# Patient Record
Sex: Female | Born: 1945 | Race: Black or African American | Hispanic: No | Marital: Married | Smoking: Never smoker
Health system: Southern US, Community
[De-identification: ages and names within clinical notes are randomized; demographics above are authoritative.]

## PROBLEM LIST (undated history)

## (undated) DIAGNOSIS — M1990 Unspecified osteoarthritis, unspecified site: Secondary | ICD-10-CM

## (undated) DIAGNOSIS — M2240 Chondromalacia patellae, unspecified knee: Secondary | ICD-10-CM

## (undated) DIAGNOSIS — I10 Essential (primary) hypertension: Secondary | ICD-10-CM

## (undated) HISTORY — PX: TONSILLECTOMY: SUR1361

## (undated) HISTORY — DX: Chondromalacia patellae, unspecified knee: M22.40

## (undated) HISTORY — DX: Unspecified osteoarthritis, unspecified site: M19.90

## (undated) HISTORY — DX: Essential (primary) hypertension: I10

## (undated) HISTORY — PX: TUBAL LIGATION: SHX77

## (undated) HISTORY — PX: APPENDECTOMY: SHX54

## (undated) HISTORY — PX: BARTHOLIN CYST MARSUPIALIZATION: SHX5383

---

## 1998-07-10 ENCOUNTER — Other Ambulatory Visit: Admission: RE | Admit: 1998-07-10 | Discharge: 1998-07-10 | Payer: Self-pay | Admitting: Obstetrics & Gynecology

## 1999-07-25 ENCOUNTER — Other Ambulatory Visit: Admission: RE | Admit: 1999-07-25 | Discharge: 1999-07-25 | Payer: Self-pay | Admitting: Obstetrics & Gynecology

## 2000-08-25 ENCOUNTER — Other Ambulatory Visit: Admission: RE | Admit: 2000-08-25 | Discharge: 2000-08-25 | Payer: Self-pay | Admitting: Obstetrics & Gynecology

## 2001-11-02 ENCOUNTER — Other Ambulatory Visit: Admission: RE | Admit: 2001-11-02 | Discharge: 2001-11-02 | Payer: Self-pay | Admitting: Obstetrics & Gynecology

## 2002-09-27 ENCOUNTER — Encounter: Payer: Self-pay | Admitting: Gastroenterology

## 2002-09-27 ENCOUNTER — Ambulatory Visit (HOSPITAL_COMMUNITY): Admission: RE | Admit: 2002-09-27 | Discharge: 2002-09-27 | Payer: Self-pay | Admitting: Gastroenterology

## 2002-11-09 ENCOUNTER — Other Ambulatory Visit: Admission: RE | Admit: 2002-11-09 | Discharge: 2002-11-09 | Payer: Self-pay | Admitting: Obstetrics & Gynecology

## 2003-04-18 ENCOUNTER — Ambulatory Visit (HOSPITAL_COMMUNITY): Admission: RE | Admit: 2003-04-18 | Discharge: 2003-04-18 | Payer: Self-pay | Admitting: Gastroenterology

## 2003-04-20 ENCOUNTER — Ambulatory Visit (HOSPITAL_COMMUNITY): Admission: RE | Admit: 2003-04-20 | Discharge: 2003-04-20 | Payer: Self-pay | Admitting: Gastroenterology

## 2003-04-20 ENCOUNTER — Encounter: Payer: Self-pay | Admitting: Gastroenterology

## 2003-10-09 ENCOUNTER — Ambulatory Visit (HOSPITAL_COMMUNITY): Admission: RE | Admit: 2003-10-09 | Discharge: 2003-10-09 | Payer: Self-pay | Admitting: Gastroenterology

## 2003-11-22 ENCOUNTER — Other Ambulatory Visit: Admission: RE | Admit: 2003-11-22 | Discharge: 2003-11-22 | Payer: Self-pay | Admitting: Obstetrics & Gynecology

## 2004-08-26 ENCOUNTER — Ambulatory Visit: Payer: Self-pay | Admitting: Orthopedic Surgery

## 2004-09-16 ENCOUNTER — Ambulatory Visit: Payer: Self-pay | Admitting: Orthopedic Surgery

## 2004-10-09 ENCOUNTER — Ambulatory Visit: Payer: Self-pay | Admitting: Orthopedic Surgery

## 2004-11-18 ENCOUNTER — Ambulatory Visit: Payer: Self-pay | Admitting: Orthopedic Surgery

## 2004-11-27 ENCOUNTER — Other Ambulatory Visit: Admission: RE | Admit: 2004-11-27 | Discharge: 2004-11-27 | Payer: Self-pay | Admitting: Obstetrics & Gynecology

## 2004-11-28 ENCOUNTER — Encounter (HOSPITAL_COMMUNITY): Admission: RE | Admit: 2004-11-28 | Discharge: 2004-12-28 | Payer: Self-pay | Admitting: Orthopedic Surgery

## 2004-12-31 ENCOUNTER — Encounter (HOSPITAL_COMMUNITY): Admission: RE | Admit: 2004-12-31 | Discharge: 2005-01-30 | Payer: Self-pay | Admitting: Orthopedic Surgery

## 2005-01-20 ENCOUNTER — Ambulatory Visit: Payer: Self-pay | Admitting: Orthopedic Surgery

## 2005-07-23 ENCOUNTER — Ambulatory Visit: Payer: Self-pay | Admitting: Orthopedic Surgery

## 2006-01-29 ENCOUNTER — Ambulatory Visit: Payer: Self-pay | Admitting: Orthopedic Surgery

## 2006-03-05 ENCOUNTER — Ambulatory Visit: Payer: Self-pay | Admitting: Orthopedic Surgery

## 2007-04-01 ENCOUNTER — Ambulatory Visit: Payer: Self-pay | Admitting: Orthopedic Surgery

## 2007-10-29 ENCOUNTER — Ambulatory Visit: Payer: Self-pay | Admitting: Gastroenterology

## 2007-11-15 ENCOUNTER — Ambulatory Visit: Payer: Self-pay | Admitting: Gastroenterology

## 2008-02-23 ENCOUNTER — Ambulatory Visit: Payer: Self-pay | Admitting: Orthopedic Surgery

## 2008-02-23 DIAGNOSIS — M76899 Other specified enthesopathies of unspecified lower limb, excluding foot: Secondary | ICD-10-CM | POA: Insufficient documentation

## 2008-02-23 DIAGNOSIS — M25559 Pain in unspecified hip: Secondary | ICD-10-CM | POA: Insufficient documentation

## 2008-03-13 ENCOUNTER — Telehealth: Payer: Self-pay | Admitting: Orthopedic Surgery

## 2008-03-27 ENCOUNTER — Ambulatory Visit: Payer: Self-pay | Admitting: Orthopedic Surgery

## 2008-03-27 DIAGNOSIS — IMO0002 Reserved for concepts with insufficient information to code with codable children: Secondary | ICD-10-CM | POA: Insufficient documentation

## 2008-04-03 ENCOUNTER — Telehealth: Payer: Self-pay | Admitting: Orthopedic Surgery

## 2008-07-31 ENCOUNTER — Ambulatory Visit: Payer: Self-pay | Admitting: Orthopedic Surgery

## 2008-08-04 ENCOUNTER — Telehealth: Payer: Self-pay | Admitting: Orthopedic Surgery

## 2008-08-07 ENCOUNTER — Ambulatory Visit (HOSPITAL_COMMUNITY): Admission: RE | Admit: 2008-08-07 | Discharge: 2008-08-07 | Payer: Self-pay | Admitting: Orthopedic Surgery

## 2008-08-21 ENCOUNTER — Ambulatory Visit: Payer: Self-pay | Admitting: Orthopedic Surgery

## 2008-09-15 ENCOUNTER — Telehealth: Payer: Self-pay | Admitting: Orthopedic Surgery

## 2008-10-02 ENCOUNTER — Encounter: Payer: Self-pay | Admitting: Orthopedic Surgery

## 2008-11-07 ENCOUNTER — Encounter: Payer: Self-pay | Admitting: Orthopedic Surgery

## 2008-11-20 ENCOUNTER — Ambulatory Visit: Payer: Self-pay | Admitting: Orthopedic Surgery

## 2008-11-23 ENCOUNTER — Encounter: Payer: Self-pay | Admitting: Orthopedic Surgery

## 2008-12-21 ENCOUNTER — Encounter: Payer: Self-pay | Admitting: Orthopedic Surgery

## 2009-02-26 ENCOUNTER — Ambulatory Visit: Payer: Self-pay | Admitting: Orthopedic Surgery

## 2009-02-26 DIAGNOSIS — M25569 Pain in unspecified knee: Secondary | ICD-10-CM | POA: Insufficient documentation

## 2009-05-01 ENCOUNTER — Ambulatory Visit: Payer: Self-pay | Admitting: Orthopedic Surgery

## 2009-05-01 DIAGNOSIS — IMO0002 Reserved for concepts with insufficient information to code with codable children: Secondary | ICD-10-CM | POA: Insufficient documentation

## 2009-05-01 DIAGNOSIS — M1710 Unilateral primary osteoarthritis, unspecified knee: Secondary | ICD-10-CM

## 2009-06-14 ENCOUNTER — Ambulatory Visit: Payer: Self-pay | Admitting: Orthopedic Surgery

## 2009-07-26 ENCOUNTER — Ambulatory Visit: Payer: Self-pay | Admitting: Orthopedic Surgery

## 2009-08-20 ENCOUNTER — Encounter: Payer: Self-pay | Admitting: Orthopedic Surgery

## 2010-04-15 ENCOUNTER — Ambulatory Visit: Payer: Self-pay | Admitting: Orthopedic Surgery

## 2010-04-15 DIAGNOSIS — M549 Dorsalgia, unspecified: Secondary | ICD-10-CM | POA: Insufficient documentation

## 2010-04-22 ENCOUNTER — Encounter: Payer: Self-pay | Admitting: Orthopedic Surgery

## 2010-05-27 ENCOUNTER — Ambulatory Visit: Payer: Self-pay | Admitting: Orthopedic Surgery

## 2010-09-11 ENCOUNTER — Ambulatory Visit
Admission: RE | Admit: 2010-09-11 | Discharge: 2010-09-11 | Payer: Self-pay | Source: Home / Self Care | Attending: Orthopedic Surgery | Admitting: Orthopedic Surgery

## 2010-09-11 DIAGNOSIS — M1990 Unspecified osteoarthritis, unspecified site: Secondary | ICD-10-CM | POA: Insufficient documentation

## 2010-09-17 NOTE — Assessment & Plan Note (Signed)
Summary: 6 WK RE-CK BACK FOL'G PT/BCBS/CAF   Visit Type:  Follow-up  CC:  recheck back.  History of Present Illness: I saw Nancy George in the office today for a followup visit.  She is a 65 years old woman with the complaint of:  back pain.  status post physical therapy.  Not taking any medicines  Pain has resolved  Patient has excellent back flexion no tenderness  Doing well discharged from treatment for her back  Allergies: 1)  ! Penicillin   Impression & Recommendations:  Problem # 1:  BACK PAIN (ICD-724.5) Assessment Improved  Her updated medication list for this problem includes:    Celebrex 200 Mg Caps (Celecoxib) .Marland Kitchen... 1 q day  Orders: Est. Patient Level II (16109)  Patient Instructions: 1)  f/u as needed

## 2010-09-17 NOTE — Miscellaneous (Signed)
  celebrex 200 x 3 months   5 refills Clinical Lists Changes

## 2010-09-17 NOTE — Assessment & Plan Note (Signed)
Summary: RE-CK BACK/STATES TWISTED 04/07/10/NEED XRAY/BCBS/CAF   Visit Type:  Follow-up  CC:  back pain.  History of Present Illness: I saw Nancy George in the office today for a followup visit.  She is a 65 years old woman with the complaint of:  back pain.  Back pain for 2 weeks. Pain in her back on her right side and when it is bad it goes down to her knee.  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  She has a little bit of pain with the seated straight leg raise when the foot is dorsiflexed on the RIGHT but not on the LEFT  Tenderness lumbar spine and the RIGHT gluteal region  Impression strain back possible annular tear  Rest and physical therapy  Allergies: 1)  ! Penicillin  Review of Systems Neurologic:  Complains of tingling. Musculoskeletal:  Complains of instability.  Physical Exam  Additional Exam:  RIGHT SLR SEATED NEGATIVE LEFT    Impression & Recommendations:  Problem # 1:  BACK PAIN (ICD-724.5) Assessment New  The following medications were removed from the medication list:    Celebrex 200 Mg Caps (Celecoxib) .Marland Kitchen... 1 q day Her updated medication list for this problem includes:    Celebrex 200 Mg Caps (Celecoxib) .Marland Kitchen... 1 q day  Orders: Est. Patient Level III (81829)  Patient Instructions: 1)  Start PT at [facility]  2)  use heat at night  3)  TAKE ADVIL 400 MG three times a day FOR BACK PAIN  4)  RETURN IN 8 WEEKS

## 2010-09-17 NOTE — Letter (Signed)
Summary: Generic Letter  [facility]  [address]  [place], [place] [address]   Phone: [phone]  Fax: [phone]    10/02/2008  Nancy George [address] [place], [place]  [address]  To whom it may concern:   Therapeutic measures at integrative therapies would benefit her orthopaedic health conditions.  Sincerely,   Fuller Canada MD [facility]

## 2010-09-17 NOTE — Miscellaneous (Signed)
Summary: Rehab Report  Rehab Report   Imported By: Elvera Maria 11/14/2008 16:29:05  _____________________________________________________________________  External Attachment:    Type:   Image     Comment:   eval

## 2010-09-17 NOTE — Miscellaneous (Signed)
Summary: PT evaluation  PT evaluation   Imported By: Jacklynn Ganong 04/30/2010 14:26:31  _____________________________________________________________________  External Attachment:    Type:   Image     Comment:   External Document

## 2010-09-19 NOTE — Assessment & Plan Note (Signed)
Summary: BACK HURTING AGAIN,?XR BCBS/BSF   Visit Type:  Follow-up  CC:  back pain.  History of Present Illness:   This is a 22 femoral female followed for back pain. Currently taking ibuprofen 800 mg as needed. She had some back pain a few weeks ago. It was constant and it started to increase. She called for an appointment then changed her exercise routine and the symptoms started to decrease.  She does have some radiating pain down from her SI joint and gluteal region into her RIGHT foot, but primarily it radiates to the RIGHT knee.  She denies any night sweats, weight loss, or urinary symptoms.    Allergies: 1)  ! Penicillin  Physical Exam  Additional Exam:   She stands with a normal posture.  She has tenderness over the RIGHT SI joint. lower lumbar joints are nontender. She has normal tiptoe test and normal heel rise test with negative straight leg raises bilaterally, negative Lasegue's sign. Negative cross leg straight leg raise.  Pulse and perfusion are normal in the lower extremities.  Range of motion of the hips is normal with no pain.  Ambulation normal   Impression & Recommendations:  Problem # 1:  BACK PAIN (ICD-724.5)  Her updated medication list for this problem includes:    Celebrex 200 Mg Caps (Celecoxib) .Marland Kitchen... 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  We gave her an injection in the RIGHT SI joint.  verbal consent was obtained for SI joint injection Under sterile conditions, the sacroiliac joint was injected with a 1-2 mixture of Depo-Medrol and lidocaine.  No complications  Orders: Est. Patient Level III (47829) Joint Aspirate / Injection, Large (20610) Depo- Medrol 40mg  (J1030)  Patient Instructions: 1)  You have received an injection of cortisone today. You may experience increased pain at the injection site. Apply ice pack to the area for 20 minutes every 2 hours and take 2 xtra strength  tylenol every 8 hours. This increased pain will usually resolve in 24 hours. The injection will take effect in 3-10 days.  2)  Continue IBUPROFEN as needed  3)  Please schedule a follow-up appointment as needed.   Orders Added: 1)  Est. Patient Level III [56213] 2)  Joint Aspirate / Injection, Large [20610] 3)  Depo- Medrol 40mg  [J1030]

## 2011-01-02 ENCOUNTER — Other Ambulatory Visit: Payer: Self-pay | Admitting: Obstetrics & Gynecology

## 2011-12-10 ENCOUNTER — Ambulatory Visit (INDEPENDENT_AMBULATORY_CARE_PROVIDER_SITE_OTHER): Payer: BC Managed Care – PPO | Admitting: Orthopedic Surgery

## 2011-12-10 ENCOUNTER — Encounter: Payer: Self-pay | Admitting: Orthopedic Surgery

## 2011-12-10 ENCOUNTER — Ambulatory Visit (INDEPENDENT_AMBULATORY_CARE_PROVIDER_SITE_OTHER): Payer: BC Managed Care – PPO

## 2011-12-10 VITALS — BP 100/60 | Ht 65.0 in | Wt 169.0 lb

## 2011-12-10 DIAGNOSIS — M25569 Pain in unspecified knee: Secondary | ICD-10-CM

## 2011-12-10 DIAGNOSIS — M25561 Pain in right knee: Secondary | ICD-10-CM

## 2011-12-10 DIAGNOSIS — M2240 Chondromalacia patellae, unspecified knee: Secondary | ICD-10-CM | POA: Insufficient documentation

## 2011-12-10 HISTORY — DX: Chondromalacia patellae, unspecified knee: M22.40

## 2011-12-10 NOTE — Progress Notes (Signed)
Patient ID: Nancy George, female   DOB: 1946/01/31, 66 y.o.   MRN: [id number] Chief Complaint  Patient presents with  . Knee Pain    right knee pain     Pain RIGHT knee.  The patient was asymptomatic for several months and then did some line dancing and seems to have had increased pain.  She has some RIGHT hip sciatica as well.  She has a history of osteoarthritis of the patellofemoral joint.  Repeat x-rays showed that she has patellofemoral arthritis. The tibiofemoral articulation seemed to be fairly preserved.  Review of systems otherwise negative.  Exam she is a well-developed, well-nourished, female, grooming, and hygiene, are normal. She is oriented x3. Mood is pleasant. She is ambulatory without any assistive devices and he is stable. Strength is normal. Chest tenderness in the lateral patellofemoral joint with positive crepitation, and patellofemoral compression test.  Impression patellofemoral arthritis.  Recommend activity modification, straight leg raises are to be avoided. She can do terminal knee extensions and quad sets, which were shown to her.  Followup as needed take over-the-counter medications, which have worked up to this point, take those as needed

## 2011-12-10 NOTE — Patient Instructions (Addendum)
Maintain ideal bodyweight   Maintain good thigh muscle strength: do terminal knee extensions and quad sets 3 sets of 10 3 x a week

## 2012-08-18 ENCOUNTER — Encounter (HOSPITAL_BASED_OUTPATIENT_CLINIC_OR_DEPARTMENT_OTHER): Payer: Self-pay | Admitting: *Deleted

## 2012-08-18 ENCOUNTER — Emergency Department (HOSPITAL_BASED_OUTPATIENT_CLINIC_OR_DEPARTMENT_OTHER)
Admission: EM | Admit: 2012-08-18 | Discharge: 2012-08-18 | Disposition: A | Discharge status: Home / Self Care | Payer: BC Managed Care – PPO | Attending: Emergency Medicine | Admitting: Emergency Medicine

## 2012-08-18 DIAGNOSIS — M5410 Radiculopathy, site unspecified: Secondary | ICD-10-CM

## 2012-08-18 DIAGNOSIS — Z79899 Other long term (current) drug therapy: Secondary | ICD-10-CM | POA: Insufficient documentation

## 2012-08-18 DIAGNOSIS — M5412 Radiculopathy, cervical region: Secondary | ICD-10-CM | POA: Insufficient documentation

## 2012-08-18 DIAGNOSIS — I10 Essential (primary) hypertension: Secondary | ICD-10-CM | POA: Insufficient documentation

## 2012-08-18 DIAGNOSIS — Z8739 Personal history of other diseases of the musculoskeletal system and connective tissue: Secondary | ICD-10-CM | POA: Insufficient documentation

## 2012-08-18 MED ORDER — PREDNISONE 20 MG PO TABS: 40.0000 mg | ORAL_TABLET | Freq: Every day | ORAL | Status: DC

## 2012-08-18 MED ORDER — HYDROCODONE-ACETAMINOPHEN 5-325 MG PO TABS
2.0000 | ORAL_TABLET | Freq: Once | ORAL | Status: AC
  Administered 2012-08-18: 2 via ORAL
  Filled 2012-08-18: qty 2

## 2012-08-18 MED ORDER — HYDROCODONE-ACETAMINOPHEN 5-325 MG PO TABS: ORAL_TABLET | ORAL | Status: DC

## 2012-08-18 NOTE — Discharge Instructions (Signed)
 Cervical Radiculopathy Cervical radiculopathy happens when a nerve in the neck is pinched or bruised by a slipped (herniated) disk or by arthritic changes in the bones of the cervical spine. This can occur due to an injury or as part of the normal aging process. Pressure on the cervical nerves can cause pain or numbness that runs from your neck all the way down into your arm and fingers. CAUSES  There are many possible causes, including:  Injury.  Muscle tightness in the neck from overuse.  Swollen, painful joints (arthritis).  Breakdown or degeneration in the bones and joints of the spine (spondylosis) due to aging.  Bone spurs that may develop near the cervical nerves. SYMPTOMS  Symptoms include pain, weakness, or numbness in the affected arm and hand. Pain can be severe or irritating. Symptoms may be worse when extending or turning the neck. DIAGNOSIS  Your caregiver will ask about your symptoms and do a physical exam. He or she may test your strength and reflexes. X-rays, CT scans, and MRI scans may be needed in cases of injury or if the symptoms do not go away after a period of time. Electromyography (EMG) or nerve conduction testing may be done to study how your nerves and muscles are working. TREATMENT  Your caregiver may recommend certain exercises to help relieve your symptoms. Cervical radiculopathy can, and often does, get better with time and treatment. If your problems continue, treatment options may include:  Wearing a soft collar for short periods of time.  Physical therapy to strengthen the neck muscles.  Medicines, such as nonsteroidal anti-inflammatory drugs (NSAIDs), oral corticosteroids, or spinal injections.  Surgery. Different types of surgery may be done depending on the cause of your problems. HOME CARE INSTRUCTIONS   Put ice on the affected area.  Put ice in a plastic bag.  Place a towel between your skin and the bag.  Leave the ice on for 15 to 20  minutes, 3 to 4 times a day or as directed by your caregiver.  If ice does not help, you can try using heat. Take a warm shower or bath, or use a hot water bottle as directed by your caregiver.  You may try a gentle neck and shoulder massage.  Use a flat pillow when you sleep.  Only take over-the-counter or prescription medicines for pain, discomfort, or fever as directed by your caregiver.  If physical therapy was prescribed, follow your caregiver's directions.  If a soft collar was prescribed, use it as directed. SEEK IMMEDIATE MEDICAL CARE IF:   Your pain gets much worse and cannot be controlled with medicines.  You have weakness or numbness in your hand, arm, face, or leg.  You have a high fever or a stiff, rigid neck.  You lose bowel or bladder control (incontinence).  You have trouble with walking, balance, or speaking. MAKE SURE YOU:   Understand these instructions.  Will watch your condition.  Will get help right away if you are not doing well or get worse. Document Released: 04/29/2001 Document Revised: 10/27/2011 Document Reviewed: 03/18/2011 [facility] Patient Information 2013 [place], [place].    Narcotic and benzodiazepine use may cause drowsiness, slowed breathing or dependence.  Please use with caution and do not drive, operate machinery or watch young children alone while taking them.  Taking combinations of these medications or drinking alcohol will potentiate these effects.

## 2012-08-18 NOTE — ED Notes (Signed)
Patient wanted to know how long until she is seen.  Explained to patient about acuity system in the Emergency Department.  Reassured patient that a provider will see her as soon as possible.

## 2012-08-18 NOTE — ED Notes (Addendum)
Pt describes left shoulder and back pain onset Sunday. Radiates down arm at times and woke her up today. Denies other s/s. Stiff with movement.

## 2012-08-18 NOTE — ED Provider Notes (Signed)
History     CSN: [id number]  Arrival date & time 08/18/12  1549   First MD Initiated Contact with Patient 08/18/12 1759      Chief Complaint  Patient presents with  . Shoulder Pain    (Consider location/radiation/quality/duration/timing/severity/associated sxs/prior treatment) HPI Comments: Pt reports 2 nights ago, woke up and gradually developed worsening nagging pain that feels like ache along right side of neck and into shoulder and occasional pain shoots down right arm, no further than elbow.  Movement of shoulder doesn't worsen, but sometimes if lays on right shoulder, will feel worse.  No numbness or weakness.  Pt is right handed.  No recent trauma, no fever, no rash.  Denies slurred speech, blurred vision or other focal deficits.  Pt also denies CP, SOB, nausea, sweats.  She has trouble taking ibuprofen due to stomach problems.  She denies smoking, drinking drugs.    The history is provided by the patient and the spouse.    Past Medical History  Diagnosis Date  . HTN (hypertension)   . 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 Appendectomy   . Tubal ligation   . Tonsillectomy   . Bartholin cyst marsupialization     History reviewed. No pertinent family history.  History  Substance Use Topics  . Smoking status: Never Smoker   . Smokeless tobacco: Not on file  . Alcohol Use: No    OB History    Grav Para Term Preterm Abortions TAB SAB Ect Mult Living                  Review of Systems  Constitutional: Negative for fever and chills.  HENT: Positive for neck pain.   Respiratory: Negative for cough and shortness of breath.   Cardiovascular: Negative for chest pain.  Gastrointestinal: Negative for nausea.  Musculoskeletal: Positive for arthralgias. Negative for joint swelling.  Skin: Negative for rash and wound.  Neurological: Negative for dizziness, weakness, numbness and headaches.  All other systems reviewed and are  negative.    Allergies  Penicillins  Home Medications   Current Outpatient Rx  Name  Route  Sig  Dispense  Refill  . HYDROCODONE-ACETAMINOPHEN 5-325 MG PO TABS      1-2 tablets po q 6 hours prn moderate to severe pain   20 tablet   0   . PREDNISONE 20 MG PO TABS   Oral   Take 2 tablets (40 mg total) by mouth daily.   14 tablet   0   . TRIAMTERENE-HCTZ 37.5-25 MG PO TABS   Oral   Take 1 tablet by mouth daily.           BP 151/83  Pulse 72  Temp 98.7 F (37.1 C) (Oral)  Resp 18  Ht 5\' 5"  (1.651 m)  Wt 176 lb (79.833 kg)  BMI 29.29 kg/m2  SpO2 99%  Physical Exam  Nursing note and vitals reviewed. Constitutional: She is oriented to person, place, and time. She appears well-developed and well-nourished. No distress.  HENT:  Head: Normocephalic and atraumatic.  Eyes: EOM are normal. Pupils are equal, round, and reactive to light.  Neck: Normal range of motion. Neck supple. Muscular tenderness present.       With neck flexion, symptoms of pain going down right UE is worse  Cardiovascular: Normal rate and regular rhythm.   Pulmonary/Chest: Effort normal. No respiratory distress.  Abdominal: Soft.  Musculoskeletal:  Right shoulder: She exhibits pain. She exhibits normal range of motion, no tenderness, no bony tenderness, no crepitus, no deformity, no spasm, normal pulse and normal strength.       Right elbow: Normal. Neurological: She is alert and oriented to person, place, and time. She has normal strength. No sensory deficit. She exhibits normal muscle tone. Coordination normal. GCS eye subscore is 4. GCS verbal subscore is 5. GCS motor subscore is 6.  Skin: Skin is warm and dry. No rash noted. She is not diaphoretic. No erythema.  Psychiatric: She has a normal mood and affect.    ED Course  Procedures (including critical care time)  Labs Reviewed - No data to display No results found.   1. Radiculopathy affecting upper extremity     Room air  saturation is 99% I interpret this to be normal.  ECG at time 16:15 shows normal sinus rhythm at a rate of 70. Borderline low QRS voltages in the frontal leads. Otherwise normal ECG. No prior EKGs are available.   MDM  Patient with atypical symptoms for coronary disease. ECG shows no ischemia. I doubt this is coronary related. Patient's symptoms are worse when she flexes her neck forward sending I nerve like pain which she describes as throbbing down her right upper extremity. I suspect that this pain is more of a cervical radiculopathy. We discussed that plain films likely would not be helpful. My plan is to provide analgesics and steroids for anti-inflammation. She sees Dr. Romeo Apple with orthopedics, and the patient and family are comfortable going home and following up with Dr. Romeo Apple as an outpatient. I offered to arrange a cervical MRI for the patient but the family declines that, instead deferring to Dr. Romeo Apple after followup. He also would like to try to steroids for a few days to see if the symptoms actually do improve.        Gavin Pound. Oletta Lamas, MD 08/19/12 1610

## 2012-09-15 ENCOUNTER — Ambulatory Visit: Payer: BC Managed Care – PPO | Admitting: Orthopedic Surgery

## 2012-09-21 ENCOUNTER — Encounter: Payer: Self-pay | Admitting: Orthopedic Surgery

## 2012-09-21 ENCOUNTER — Other Ambulatory Visit: Payer: Self-pay | Admitting: Orthopedic Surgery

## 2012-09-21 ENCOUNTER — Ambulatory Visit (INDEPENDENT_AMBULATORY_CARE_PROVIDER_SITE_OTHER): Payer: BC Managed Care – PPO | Admitting: Orthopedic Surgery

## 2012-09-21 ENCOUNTER — Ambulatory Visit (INDEPENDENT_AMBULATORY_CARE_PROVIDER_SITE_OTHER): Payer: BC Managed Care – PPO

## 2012-09-21 VITALS — BP 112/70 | Ht 65.0 in | Wt 182.6 lb

## 2012-09-21 DIAGNOSIS — M542 Cervicalgia: Secondary | ICD-10-CM

## 2012-09-21 DIAGNOSIS — Q7649 Other congenital malformations of spine, not associated with scoliosis: Secondary | ICD-10-CM

## 2012-09-21 DIAGNOSIS — M47812 Spondylosis without myelopathy or radiculopathy, cervical region: Secondary | ICD-10-CM

## 2012-09-21 DIAGNOSIS — M25519 Pain in unspecified shoulder: Secondary | ICD-10-CM

## 2012-09-21 DIAGNOSIS — M4302 Spondylolysis, cervical region: Secondary | ICD-10-CM | POA: Insufficient documentation

## 2012-09-21 MED ORDER — DICLOFENAC SODIUM 50 MG PO TBEC: 50.0000 mg | DELAYED_RELEASE_TABLET | Freq: Two times a day (BID) | ORAL | Status: DC

## 2012-09-21 NOTE — Patient Instructions (Addendum)
Degenerative Disk Disease Degenerative disk disease is a condition caused by the changes that occur in the cushions of the backbone (spinal disks) as you grow older. Spinal disks are soft and compressible disks located between the bones of the spine (vertebrae). They act like shock absorbers. Degenerative disk disease can affect the whole spine. However, the neck and lower back are most commonly affected. Many changes can occur in the spinal disks with aging, such as:  The spinal disks may dry and shrink.   Small tears may occur in the tough, outer covering of the disk (annulus).   The disk space may become smaller due to loss of water.   Abnormal growths in the bone (spurs) may occur. This can put pressure on the nerve roots exiting the spinal canal, causing pain.   The spinal canal may become narrowed.  CAUSES   Degenerative disk disease is a condition caused by the changes that occur in the spinal disks with aging. The exact cause is not known, but there is a genetic basis for many patients. Degenerative changes can occur due to loss of fluid in the disk. This makes the disk thinner and reduces the space between the backbones. Small cracks can develop in the outer layer of the disk. This can lead to the breakdown of the disk. You are more likely to get degenerative disk disease if you are overweight. Smoking cigarettes and doing heavy work such as weightlifting can also increase your risk of this condition. Degenerative changes can start after a sudden injury. Growth of bone spurs can compress the nerve roots and cause pain.   SYMPTOMS   The symptoms vary from person to person. Some people may have no pain, while others have severe pain. The pain may be so severe that it can limit your activities. The location of the pain depends on the part of your backbone that is affected. You will have neck or arm pain if a disk in the neck area is affected. You will have pain in your back, buttocks, or legs if  a disk in the lower back is affected. The pain becomes worse while bending, reaching up, or with twisting movements. The pain may start gradually and then get worse as time passes. It may also start after a major or minor injury. You may feel numbness or tingling in the arms or legs.   DIAGNOSIS   Your caregiver will ask you about your symptoms and about activities or habits that may cause the pain. He or she may also ask about any injuries, diseases, or treatments you have had earlier. Your caregiver will examine you to check for the range of movement that is possible in the affected area, to check for strength in your extremities, and to check for sensation in the areas of the arms and legs supplied by different nerve roots. An X-ray of the spine may be taken. Your caregiver may suggest other imaging tests, such as magnetic resonance imaging (MRI), if needed.   TREATMENT   Treatment includes rest, modifying your activities, and applying ice and heat. Your caregiver may prescribe medicines to reduce your pain and may ask you to do some exercises to strengthen your neck. In some cases, you may need surgery. You and your caregiver will decide on the treatment that is best for you. HOME CARE INSTRUCTIONS    Follow proper lifting and walking techniques as advised by your caregiver.   Maintain good posture.   Exercise regularly as advised.  Perform relaxation exercises.   Change your sitting, standing, and sleeping habits as advised. Change positions frequently.   Lose weight as advised.   Stop smoking if you smoke.   Wear supportive footwear.  SEEK MEDICAL CARE IF:   Your pain does not go away within 1 to 4 weeks. SEEK IMMEDIATE MEDICAL CARE IF:    Your pain is severe.   You notice weakness in your arms, hands, or legs.   You begin to lose control of your bladder or bowel movements.  MAKE SURE YOU:    Understand these instructions.   Will watch your condition.   Will get help right  away if you are not doing well or get worse.  Document Released: 06/01/2007 Document Revised: 10/27/2011 Document Reviewed: 06/01/2007 [facility] Patient Information 2013 [place], [place].   PT [place] 12 visits

## 2012-09-21 NOTE — Progress Notes (Signed)
Patient ID: Nancy George, female   DOB: Mar 25, 1946, 67 y.o.   MRN: [id number] Chief Complaint  Patient presents with  . Shoulder Pain    right neck and shoulder pain no injury      67 year old female started having pain in her neck and shoulder in December of 2013 she sought chiropractic treatment and manipulation with some relief. Her symptoms came on suddenly no evidence of numbness questionable tingling in her long finger it seems to happen when she's having more neck pain. She has difficulty with laying down although she is improved when she's standing up she also uses some ice the pain will occur anytime  Review of systems she has some weight gain some heartburn some constipation occasional urgency muscle pain depression and seasonal allergies remaining review of systems negative  History of arthritis status post appendectomy tubal ligation she has some arthritis in her knees she is employed as an Environmental health practitioner she does not smoke or drink  BP 112/70  Ht 5\' 5"  (1.651 m)  Wt 182 lb 9.6 oz (82.827 kg)  BMI 30.39 kg/m2 Physical Exam(12)  Vital signs:   GENERAL: normal development   CDV: pulses are normal   Skin: normal  Lymph: nodes were not palpable/normal  Psychiatric: awake, alert and oriented  Neuro: normal sensation  MSK  Gait: Ambulation remains normal  She is tender over the cervical spine at the base of the cervical spine she has lost her ability to fully turn and lateral bending is diminished as well as extension she has full flexion she has a negative Spurling's. She is tender in the right trapezius muscle no sign of instability  Her upper extremities show full range of motion strength stability and alignment Imaging x-ray show cervical spondylosis a full report is been dictated  Assessment: Cervical spondylosis mild    Plan: Recommend physical therapy and anti-inflammatory medication a followup scheduled for 6 weeks

## 2012-09-23 ENCOUNTER — Ambulatory Visit
Payer: BC Managed Care – PPO | Attending: Orthopedic Surgery | Admitting: Rehabilitative and Restorative Service Providers"

## 2012-09-23 DIAGNOSIS — M6281 Muscle weakness (generalized): Secondary | ICD-10-CM | POA: Insufficient documentation

## 2012-09-23 DIAGNOSIS — IMO0001 Reserved for inherently not codable concepts without codable children: Secondary | ICD-10-CM | POA: Insufficient documentation

## 2012-09-23 DIAGNOSIS — R269 Unspecified abnormalities of gait and mobility: Secondary | ICD-10-CM | POA: Insufficient documentation

## 2012-09-23 DIAGNOSIS — R293 Abnormal posture: Secondary | ICD-10-CM | POA: Insufficient documentation

## 2012-10-05 ENCOUNTER — Ambulatory Visit: Payer: BC Managed Care – PPO | Admitting: Rehabilitative and Restorative Service Providers"

## 2012-10-07 ENCOUNTER — Ambulatory Visit: Payer: BC Managed Care – PPO

## 2012-10-12 ENCOUNTER — Encounter: Payer: BC Managed Care – PPO | Admitting: Physical Therapy

## 2012-10-12 ENCOUNTER — Ambulatory Visit: Payer: BC Managed Care – PPO | Admitting: Physical Therapy

## 2012-10-14 ENCOUNTER — Ambulatory Visit: Payer: BC Managed Care – PPO | Admitting: Physical Therapy

## 2012-10-18 ENCOUNTER — Ambulatory Visit: Payer: BC Managed Care – PPO | Admitting: Physical Therapy

## 2012-10-19 ENCOUNTER — Encounter: Payer: Self-pay | Admitting: Gastroenterology

## 2012-10-21 ENCOUNTER — Ambulatory Visit: Payer: BC Managed Care – PPO | Attending: Orthopedic Surgery | Admitting: Physical Therapy

## 2012-10-21 DIAGNOSIS — M6281 Muscle weakness (generalized): Secondary | ICD-10-CM | POA: Insufficient documentation

## 2012-10-21 DIAGNOSIS — R293 Abnormal posture: Secondary | ICD-10-CM | POA: Insufficient documentation

## 2012-10-21 DIAGNOSIS — R269 Unspecified abnormalities of gait and mobility: Secondary | ICD-10-CM | POA: Insufficient documentation

## 2012-10-21 DIAGNOSIS — IMO0001 Reserved for inherently not codable concepts without codable children: Secondary | ICD-10-CM | POA: Insufficient documentation

## 2012-10-26 ENCOUNTER — Encounter: Payer: Self-pay | Admitting: Gastroenterology

## 2012-11-02 ENCOUNTER — Ambulatory Visit: Payer: BC Managed Care – PPO | Admitting: Orthopedic Surgery

## 2012-11-10 ENCOUNTER — Ambulatory Visit (INDEPENDENT_AMBULATORY_CARE_PROVIDER_SITE_OTHER): Payer: BC Managed Care – PPO | Admitting: Orthopedic Surgery

## 2012-11-10 ENCOUNTER — Encounter: Payer: Self-pay | Admitting: Orthopedic Surgery

## 2012-11-10 VITALS — BP 118/80 | Ht 65.0 in | Wt 182.0 lb

## 2012-11-10 DIAGNOSIS — Q7649 Other congenital malformations of spine, not associated with scoliosis: Secondary | ICD-10-CM

## 2012-11-10 DIAGNOSIS — M4302 Spondylolysis, cervical region: Secondary | ICD-10-CM

## 2012-11-10 NOTE — Patient Instructions (Addendum)
Continue home exercises.

## 2012-11-10 NOTE — Progress Notes (Signed)
Patient ID: Nancy George, female   DOB: [birth year], 67 y.o.   MRN: [id number] Chief Complaint  Patient presents with  . Follow-up    cervical spondylosis    BP 118/80  Ht 5\' 5"  (1.651 m)  Wt 182 lb (82.555 kg)  BMI 30.29 kg/m2    The patient has undergone physical therapy for range of motion and pain levels have improved.  Reflexes are normal. Grip strength is normal.  Followup as needed

## 2012-11-11 ENCOUNTER — Ambulatory Visit: Payer: BC Managed Care – PPO | Admitting: Rehabilitation

## 2012-11-16 ENCOUNTER — Encounter: Payer: Self-pay | Admitting: Gastroenterology

## 2012-11-16 ENCOUNTER — Ambulatory Visit (AMBULATORY_SURGERY_CENTER): Payer: BC Managed Care – PPO | Admitting: *Deleted

## 2012-11-16 VITALS — Ht 65.0 in | Wt 180.0 lb

## 2012-11-16 DIAGNOSIS — Z1211 Encounter for screening for malignant neoplasm of colon: Secondary | ICD-10-CM

## 2012-11-16 MED ORDER — MOVIPREP 100 G PO SOLR: ORAL | Status: DC

## 2012-11-17 ENCOUNTER — Ambulatory Visit: Payer: BC Managed Care – PPO | Admitting: Orthopedic Surgery

## 2012-11-18 ENCOUNTER — Ambulatory Visit (INDEPENDENT_AMBULATORY_CARE_PROVIDER_SITE_OTHER): Payer: BC Managed Care – PPO | Admitting: Orthopedic Surgery

## 2012-11-18 ENCOUNTER — Encounter: Payer: Self-pay | Admitting: Orthopedic Surgery

## 2012-11-18 ENCOUNTER — Ambulatory Visit (INDEPENDENT_AMBULATORY_CARE_PROVIDER_SITE_OTHER): Payer: BC Managed Care – PPO

## 2012-11-18 VITALS — BP 120/78 | Ht 65.0 in | Wt 182.0 lb

## 2012-11-18 DIAGNOSIS — M25571 Pain in right ankle and joints of right foot: Secondary | ICD-10-CM

## 2012-11-18 DIAGNOSIS — M25579 Pain in unspecified ankle and joints of unspecified foot: Secondary | ICD-10-CM

## 2012-11-18 DIAGNOSIS — M722 Plantar fascial fibromatosis: Secondary | ICD-10-CM | POA: Insufficient documentation

## 2012-11-18 NOTE — Progress Notes (Signed)
Subjective:     Patient ID: Nancy George, female   DOB: [birth year], 67 y.o.   MRN: [id number]  Foot Pain This is a new problem. The current episode started 1 to 4 weeks ago. The problem occurs intermittently. The problem has been unchanged. Associated symptoms include myalgias. Pertinent negatives include no fever, rash or weakness. The symptoms are aggravated by standing and walking (Restart and start up pain). She has tried ice (Stretching) for the symptoms. The treatment provided mild relief.     Review of Systems  Constitutional: Positive for unexpected weight change. Negative for fever.  Gastrointestinal: Positive for constipation.  Musculoskeletal: Positive for myalgias.  Skin: Negative for rash.  Allergic/Immunologic: Positive for environmental allergies.  Neurological: Negative for weakness.  Psychiatric/Behavioral:       Depression       Objective:   Physical Exam BP 120/78  Ht 5\' 5"  (1.651 m)  Wt 182 lb (82.555 kg)  BMI 30.29 kg/m2 Gen. exam is normal she is oriented x3 her mood is normal she ambulates normally  Her right foot shows slight flattening there are some calluses over the dorsum of the interphalangeal joints. She is a long second toe. Range of motion at the ankle is normal including the Achilles stretch test. Ankle stable. Tenderness at the heel at the plantar fascia. Achilles nontender. Motor exam normal. Skin intact. Normal sensation and normal pulse    Assessment:     Initial assessment included x-ray of the foot which shows a calcaneal spur both at the Achilles and plantar fascial insertion otherwise noted is a mild bunion deformity of the great toe  Pain in joint, ankle and foot, right - Plan: DG Foot Complete Right  Plantar fascia syndrome      Plan:     Recommend stretching program with ice  Injection was given today  Followup 6 weeks  Foot  Injection Procedure Note  Pre-operative Diagnosis: right foot plantar fascitis Post-operative  Diagnosis: same  Indications: pain  Anesthesia: ethyl chloride   Procedure Details   Verbal consent was obtained for the procedure. Time out was completed.The heel was prepped with alcohol, followed by  Ethyl chloride spray and A 20 gauge needle was inserted into the knee via lateral approach; 4ml 1% lidocaine and 1 ml of depomedrol  was then injected into the joint . The needle was removed and the area cleansed and dressed.  Complications:  None; patient tolerated the procedure well.

## 2012-11-18 NOTE — Patient Instructions (Signed)
You have received a steroid shot. 15% of patients experience increased pain at the injection site with in the next 24 hours. This is best treated with ice and tylenol extra strength 2 tabs every 8 hours. If you are still having pain please call the office.  Plantar Fasciitis (Heel Spur Syndrome) with Rehab The plantar fascia is a fibrous, ligament-like, soft-tissue structure that spans the bottom of the foot. Plantar fasciitis is a condition that causes pain in the foot due to inflammation of the tissue. SYMPTOMS   Pain and tenderness on the underneath side of the foot.  Pain that worsens with standing or walking. CAUSES  Plantar fasciitis is caused by irritation and injury to the plantar fascia on the underneath side of the foot. Common mechanisms of injury include:  Direct trauma to bottom of the foot.  Damage to a small nerve that runs under the foot where the main fascia attaches to the heel bone.  Stress placed on the plantar fascia due to bone spurs. RISK INCREASES WITH:   Activities that place stress on the plantar fascia (running, jumping, pivoting, or cutting).  Poor strength and flexibility.  Improperly fitted shoes.  Tight calf muscles.  Flat feet.  Failure to warm-up properly before activity.  Obesity. PREVENTION  Warm up and stretch properly before activity.  Allow for adequate recovery between workouts.  Maintain physical fitness:  Strength, flexibility, and endurance.  Cardiovascular fitness.  Maintain a health body weight.  Avoid stress on the plantar fascia.  Wear properly fitted shoes, including arch supports for individuals who have flat feet. PROGNOSIS  If treated properly, then the symptoms of plantar fasciitis usually resolve without surgery. However, occasionally surgery is necessary. RELATED COMPLICATIONS   Recurrent symptoms that may result in a chronic condition.  Problems of the lower back that are caused by compensating for the  injury, such as limping.  Pain or weakness of the foot during push-off following surgery.  Chronic inflammation, scarring, and partial or complete fascia tear, occurring more often from repeated injections. TREATMENT  Treatment initially involves the use of ice and medication to help reduce pain and inflammation. The use of strengthening and stretching exercises may help reduce pain with activity, especially stretches of the Achilles tendon. These exercises may be performed at home or with a therapist. Your caregiver may recommend that you use heel cups of arch supports to help reduce stress on the plantar fascia. Occasionally, corticosteroid injections are given to reduce inflammation. If symptoms persist for greater than 6 months despite non-surgical (conservative), then surgery may be recommended.  MEDICATION   If pain medication is necessary, then nonsteroidal anti-inflammatory medications, such as aspirin and ibuprofen, or other minor pain relievers, such as acetaminophen, are often recommended.  Do not take pain medication within 7 days before surgery.  Prescription pain relievers may be given if deemed necessary by your caregiver. Use only as directed and only as much as you need.  Corticosteroid injections may be given by your caregiver. These injections should be reserved for the most serious cases, because they may only be given a certain number of times. HEAT AND COLD  Cold treatment (icing) relieves pain and reduces inflammation. Cold treatment should be applied for 10 to 15 minutes every 2 to 3 hours for inflammation and pain and immediately after any activity that aggravates your symptoms. Use ice packs or massage the area with a piece of ice (ice massage).  Heat treatment may be used prior to performing the stretching  and strengthening activities prescribed by your caregiver, physical therapist, or athletic trainer. Use a heat pack or soak the injury in warm water. SEEK IMMEDIATE  MEDICAL CARE IF:  Treatment seems to offer no benefit, or the condition worsens.  Any medications produce adverse side effects. EXERCISES RANGE OF MOTION (ROM) AND STRETCHING EXERCISES - Plantar Fasciitis (Heel Spur Syndrome) These exercises may help you when beginning to rehabilitate your injury. Your symptoms may resolve with or without further involvement from your physician, physical therapist or athletic trainer. While completing these exercises, remember:   Restoring tissue flexibility helps normal motion to return to the joints. This allows healthier, less painful movement and activity.  An effective stretch should be held for at least 30 seconds.  A stretch should never be painful. You should only feel a gentle lengthening or release in the stretched tissue. RANGE OF MOTION - Toe Extension, Flexion  Sit with your right / left leg crossed over your opposite knee.  Grasp your toes and gently pull them back toward the top of your foot. You should feel a stretch on the bottom of your toes and/or foot.  Hold this stretch for __________ seconds.  Now, gently pull your toes toward the bottom of your foot. You should feel a stretch on the top of your toes and or foot.  Hold this stretch for __________ seconds. Repeat __________ times. Complete this stretch __________ times per day.  RANGE OF MOTION - Ankle Dorsiflexion, Active Assisted  Remove shoes and sit on a chair that is preferably not on a carpeted surface.  Place right / left foot under knee. Extend your opposite leg for support.  Keeping your heel down, slide your right / left foot back toward the chair until you feel a stretch at your ankle or calf. If you do not feel a stretch, slide your bottom forward to the edge of the chair, while still keeping your heel down.  Hold this stretch for __________ seconds. Repeat __________ times. Complete this stretch __________ times per day.  STRETCH  Gastroc, Standing  Place hands  on wall.  Extend right / left leg, keeping the front knee somewhat bent.  Slightly point your toes inward on your back foot.  Keeping your right / left heel on the floor and your knee straight, shift your weight toward the wall, not allowing your back to arch.  You should feel a gentle stretch in the right / left calf. Hold this position for __________ seconds. Repeat __________ times. Complete this stretch __________ times per day. STRETCH  Soleus, Standing  Place hands on wall.  Extend right / left leg, keeping the other knee somewhat bent.  Slightly point your toes inward on your back foot.  Keep your right / left heel on the floor, bend your back knee, and slightly shift your weight over the back leg so that you feel a gentle stretch deep in your back calf.  Hold this position for __________ seconds. Repeat __________ times. Complete this stretch __________ times per day. STRETCH  Gastrocsoleus, Standing  Note: This exercise can place a lot of stress on your foot and ankle. Please complete this exercise only if specifically instructed by your caregiver.   Place the ball of your right / left foot on a step, keeping your other foot firmly on the same step.  Hold on to the wall or a rail for balance.  Slowly lift your other foot, allowing your body weight to press your heel down over  the edge of the step.  You should feel a stretch in your right / left calf.  Hold this position for __________ seconds.  Repeat this exercise with a slight bend in your right / left knee. Repeat __________ times. Complete this stretch __________ times per day.  STRENGTHENING EXERCISES - Plantar Fasciitis (Heel Spur Syndrome)  These exercises may help you when beginning to rehabilitate your injury. They may resolve your symptoms with or without further involvement from your physician, physical therapist or athletic trainer. While completing these exercises, remember:   Muscles can gain both the  endurance and the strength needed for everyday activities through controlled exercises.  Complete these exercises as instructed by your physician, physical therapist or athletic trainer. Progress the resistance and repetitions only as guided. STRENGTH - Towel Curls  Sit in a chair positioned on a non-carpeted surface.  Place your foot on a towel, keeping your heel on the floor.  Pull the towel toward your heel by only curling your toes. Keep your heel on the floor.  If instructed by your physician, physical therapist or athletic trainer, add ____________________ at the end of the towel. Repeat __________ times. Complete this exercise __________ times per day. STRENGTH - Ankle Inversion  Secure one end of a rubber exercise band/tubing to a fixed object (table, pole). Loop the other end around your foot just before your toes.  Place your fists between your knees. This will focus your strengthening at your ankle.  Slowly, pull your big toe up and in, making sure the band/tubing is positioned to resist the entire motion.  Hold this position for __________ seconds.  Have your muscles resist the band/tubing as it slowly pulls your foot back to the starting position. Repeat __________ times. Complete this exercises __________ times per day.  Document Released: 08/04/2005 Document Revised: 10/27/2011 Document Reviewed: 11/16/2008 [facility] Patient Information 2013 [place], [place].

## 2012-11-25 ENCOUNTER — Telehealth: Payer: Self-pay | Admitting: Gastroenterology

## 2012-11-25 NOTE — Telephone Encounter (Signed)
Patient advised that as long as she has not fever, she should be able to keep her appt for 11/30/12

## 2012-11-30 ENCOUNTER — Encounter: Payer: Self-pay | Admitting: Gastroenterology

## 2012-11-30 ENCOUNTER — Ambulatory Visit (AMBULATORY_SURGERY_CENTER): Payer: BC Managed Care – PPO | Admitting: Gastroenterology

## 2012-11-30 VITALS — BP 157/68 | HR 58 | Temp 96.2°F | Resp 14 | Ht 65.0 in | Wt 180.0 lb

## 2012-11-30 DIAGNOSIS — Z1211 Encounter for screening for malignant neoplasm of colon: Secondary | ICD-10-CM

## 2012-11-30 MED ORDER — SODIUM CHLORIDE 0.9 % IV SOLN: 500.0000 mL | INTRAVENOUS | Status: DC

## 2012-11-30 NOTE — Progress Notes (Signed)
YOU HAD AN ENDOSCOPIC PROCEDURE TODAY AT THE [facility]: Refer to the procedure report that was given to you for any specific questions about what was found during the examination.  If the procedure report does not answer your questions, please call your gastroenterologist to clarify.  If you requested that your care partner not be given the details of your procedure findings, then the procedure report has been included in a sealed envelope for you to review at your convenience later.  YOU SHOULD EXPECT: Some feelings of bloating in the abdomen. Passage of more gas than usual.  Walking can help get rid of the air that was put into your GI tract during the procedure and reduce the bloating. If you had a lower endoscopy (such as a colonoscopy or flexible sigmoidoscopy) you may notice spotting of blood in your stool or on the toilet paper. If you underwent a bowel prep for your procedure, then you may not have a normal bowel movement for a few days.  DIET: Your first meal following the procedure should be a light meal and then it is ok to progress to your normal diet.  A half-sandwich or bowl of soup is an example of a good first meal.  Heavy or fried foods are harder to digest and may make you feel nauseous or bloated.  Likewise meals heavy in dairy and vegetables can cause extra gas to form and this can also increase the bloating.  Drink plenty of fluids but you should avoid alcoholic beverages for 24 hours.  ACTIVITY: Your care partner should take you home directly after the procedure.  You should plan to take it easy, moving slowly for the rest of the day.  You can resume normal activity the day after the procedure however you should NOT DRIVE or use heavy machinery for 24 hours (because of the sedation medicines used during the test).    SYMPTOMS TO REPORT IMMEDIATELY: A gastroenterologist can be reached at any hour.  During normal business hours, 8:30 AM to 5:00 PM Monday through Friday,  call [phone].  After hours and on weekends, please call the GI answering service at [phone] who will take a message and have the physician on call contact you.   Following lower endoscopy (colonoscopy or flexible sigmoidoscopy):  Excessive amounts of blood in the stool  Significant tenderness or worsening of abdominal pains  Swelling of the abdomen that is new, acute  Fever of 100F or higher    FOLLOW UP: If any biopsies were taken you will be contacted by phone or by letter within the next 1-3 weeks.  Call your gastroenterologist if you have not heard about the biopsies in 3 weeks.  Our staff will call the home number listed on your records the next business day following your procedure to check on you and address any questions or concerns that you may have at that time regarding the information given to you following your procedure. This is a courtesy call and so if there is no answer at the home number and we have not heard from you through the emergency physician on call, we will assume that you have returned to your regular daily activities without incident.  SIGNATURES/CONFIDENTIALITY: You and/or your care partner have signed paperwork which will be entered into your electronic medical record.  These signatures attest to the fact that that the information above on your After Visit Summary has been reviewed and is understood.  Full responsibility of the confidentiality   of this discharge information lies with you and/or your care-partner.     

## 2012-11-30 NOTE — Patient Instructions (Signed)
YOU HAD AN ENDOSCOPIC PROCEDURE TODAY AT THE [facility]: Refer to the procedure report that was given to you for any specific questions about what was found during the examination.  If the procedure report does not answer your questions, please call your gastroenterologist to clarify.  If you requested that your care partner not be given the details of your procedure findings, then the procedure report has been included in a sealed envelope for you to review at your convenience later.  YOU SHOULD EXPECT: Some feelings of bloating in the abdomen. Passage of more gas than usual.  Walking can help get rid of the air that was put into your GI tract during the procedure and reduce the bloating. If you had a lower endoscopy (such as a colonoscopy or flexible sigmoidoscopy) you may notice spotting of blood in your stool or on the toilet paper. If you underwent a bowel prep for your procedure, then you may not have a normal bowel movement for a few days.  DIET: Your first meal following the procedure should be a light meal and then it is ok to progress to your normal diet.  A half-sandwich or bowl of soup is an example of a good first meal.  Heavy or fried foods are harder to digest and may make you feel nauseous or bloated.  Likewise meals heavy in dairy and vegetables can cause extra gas to form and this can also increase the bloating.  Drink plenty of fluids but you should avoid alcoholic beverages for 24 hours.  ACTIVITY: Your care partner should take you home directly after the procedure.  You should plan to take it easy, moving slowly for the rest of the day.  You can resume normal activity the day after the procedure however you should NOT DRIVE or use heavy machinery for 24 hours (because of the sedation medicines used during the test).    SYMPTOMS TO REPORT IMMEDIATELY: A gastroenterologist can be reached at any hour.  During normal business hours, 8:30 AM to 5:00 PM Monday through Friday,  call [phone].  After hours and on weekends, please call the GI answering service at [phone] who will take a message and have the physician on call contact you.   Following lower endoscopy (colonoscopy or flexible sigmoidoscopy):  Excessive amounts of blood in the stool  Significant tenderness or worsening of abdominal pains  Swelling of the abdomen that is new, acute  Fever of 100F or higher    FOLLOW UP: If any biopsies were taken you will be contacted by phone or by letter within the next 1-3 weeks.  Call your gastroenterologist if you have not heard about the biopsies in 3 weeks.  Our staff will call the home number listed on your records the next business day following your procedure to check on you and address any questions or concerns that you may have at that time regarding the information given to you following your procedure. This is a courtesy call and so if there is no answer at the home number and we have not heard from you through the emergency physician on call, we will assume that you have returned to your regular daily activities without incident.  SIGNATURES/CONFIDENTIALITY: You and/or your care partner have signed paperwork which will be entered into your electronic medical record.  These signatures attest to the fact that that the information above on your After Visit Summary has been reviewed and is understood.  Full responsibility of the confidentiality   of this discharge information lies with you and/or your care-partner.     

## 2012-11-30 NOTE — Progress Notes (Signed)
Patient did not experience any of the following events: a burn prior to discharge; a fall within the facility; wrong site/side/patient/procedure/implant event; or a hospital transfer or hospital admission upon discharge from the facility. (G8907) Patient did not have preoperative order for IV antibiotic SSI prophylaxis. (G8918)  

## 2012-11-30 NOTE — Op Note (Signed)
[facility] [address] [place], [address]   COLONOSCOPY PROCEDURE REPORT  PATIENT: Nancy, George  MR#: [id number] BIRTHDATE: 05-Jun-1946 , 66  yrs. old GENDER: Female ENDOSCOPIST: Meryl Dare, MD, [facility] REFERRED QM:VHQIO Tapper, M.D. PROCEDURE DATE:  11/30/2012 PROCEDURE:   Colonoscopy, screening ASA CLASS:   Class II INDICATIONS:average risk screening. MEDICATIONS: MAC sedation, administered by CRNA and propofol (Diprivan) 200mg  IV DESCRIPTION OF PROCEDURE:   After the risks benefits and alternatives of the procedure were thoroughly explained, informed consent was obtained.  A digital rectal exam revealed no abnormalities of the rectum.   The LB [id number]-[id number]  endoscope was introduced through the anus and advanced to the cecum, which was identified by both the appendix and ileocecal valve. No adverse events experienced.   The quality of the prep was good, using MoviPrep  The instrument was then slowly withdrawn as the colon was fully examined.  COLON FINDINGS: Mild diverticulosis was noted in the sigmoid colon. The colon was otherwise normal.  There was no diverticulosis, inflammation, polyps or cancers unless previously stated. Retroflexed views revealed internal hemorrhoids. The time to cecum=3 minutes 15 seconds.  Withdrawal time=8 minutes 45 seconds. The scope was withdrawn and the procedure completed.  COMPLICATIONS: There were no complications.  ENDOSCOPIC IMPRESSION: 1.   Mild diverticulosis was noted in the sigmoid colon 2.   Small internal hemorrhoids  RECOMMENDATIONS: 1.  High fiber diet with liberal fluid intake. 2.  Continue current colorectal screening recommendations for "routine risk" patients with a repeat colonoscopy in 10 years.   eSigned:  Meryl Dare, MD, [facility] 11/30/2012 11:34 AM

## 2012-12-01 ENCOUNTER — Telehealth: Payer: Self-pay | Admitting: *Deleted

## 2012-12-01 NOTE — Telephone Encounter (Signed)
  Follow up Call-  Call back number 11/30/2012  Post procedure Call Back phone  # [phone]  Permission to leave phone message Yes     Patient questions:  Do you have a fever, pain , or abdominal swelling? no Pain Score  0 *  Have you tolerated food without any problems? yes  Have you been able to return to your normal activities? yes  Do you have any questions about your discharge instructions: Diet   no Medications  no Follow up visit  no  Do you have questions or concerns about your Care? no  Actions: * If pain score is 4 or above: No action needed, pain <4.  "I'm doing excellent" per patient.

## 2012-12-30 ENCOUNTER — Encounter: Payer: Self-pay | Admitting: Orthopedic Surgery

## 2012-12-30 ENCOUNTER — Ambulatory Visit (INDEPENDENT_AMBULATORY_CARE_PROVIDER_SITE_OTHER): Payer: BC Managed Care – PPO | Admitting: Orthopedic Surgery

## 2012-12-30 VITALS — BP 104/72 | Ht 65.0 in | Wt 182.0 lb

## 2012-12-30 DIAGNOSIS — M722 Plantar fascial fibromatosis: Secondary | ICD-10-CM

## 2012-12-30 DIAGNOSIS — M7660 Achilles tendinitis, unspecified leg: Secondary | ICD-10-CM

## 2012-12-30 NOTE — Patient Instructions (Signed)
Call to set up therapy appointment

## 2012-12-30 NOTE — Progress Notes (Signed)
Patient ID: Nancy George, female   DOB: 01/01/1946, 67 y.o.   MRN: [id number] Chief Complaint  Patient presents with  . Follow-up    6 week recheck right foot, plantar fasciitis    Patient continues to complain of plantar foot pain and also pain along the Achilles tendon she has an over-the-counter night splint and received one injection with some improvement in the plantar fasciitis.  Review of systems no numbness or tingling  Physical exam BP 104/72  Ht 5\' 5"  (1.651 m)  Wt 182 lb (82.555 kg)  BMI 30.29 kg/m2 General appearance is normal, the patient is alert and oriented x3 with normal mood and affect. The Achilles tendon is tender she has painful dorsiflexion she still has some pain at the plantar fascial insertion. Neurovascular exam is intact  Recommend Cam Walker, physical therapy continue night splint followup in 3 weeks. On June 6 or seventh was a patgeatnt that she will attend and need to stand so I want to see her right before that.

## 2013-01-11 ENCOUNTER — Other Ambulatory Visit: Payer: Self-pay | Admitting: Obstetrics & Gynecology

## 2013-01-12 ENCOUNTER — Ambulatory Visit: Payer: BC Managed Care – PPO | Attending: Orthopedic Surgery | Admitting: Physical Therapy

## 2013-01-12 DIAGNOSIS — R293 Abnormal posture: Secondary | ICD-10-CM | POA: Insufficient documentation

## 2013-01-12 DIAGNOSIS — R269 Unspecified abnormalities of gait and mobility: Secondary | ICD-10-CM | POA: Insufficient documentation

## 2013-01-12 DIAGNOSIS — IMO0001 Reserved for inherently not codable concepts without codable children: Secondary | ICD-10-CM | POA: Insufficient documentation

## 2013-01-12 DIAGNOSIS — M6281 Muscle weakness (generalized): Secondary | ICD-10-CM | POA: Insufficient documentation

## 2013-01-19 ENCOUNTER — Ambulatory Visit: Payer: BC Managed Care – PPO | Attending: Orthopedic Surgery | Admitting: Physical Therapy

## 2013-01-19 DIAGNOSIS — R269 Unspecified abnormalities of gait and mobility: Secondary | ICD-10-CM | POA: Insufficient documentation

## 2013-01-19 DIAGNOSIS — R293 Abnormal posture: Secondary | ICD-10-CM | POA: Insufficient documentation

## 2013-01-19 DIAGNOSIS — IMO0001 Reserved for inherently not codable concepts without codable children: Secondary | ICD-10-CM | POA: Insufficient documentation

## 2013-01-19 DIAGNOSIS — M6281 Muscle weakness (generalized): Secondary | ICD-10-CM | POA: Insufficient documentation

## 2013-01-20 ENCOUNTER — Encounter: Payer: Self-pay | Admitting: Orthopedic Surgery

## 2013-01-20 ENCOUNTER — Ambulatory Visit (INDEPENDENT_AMBULATORY_CARE_PROVIDER_SITE_OTHER): Payer: BC Managed Care – PPO | Admitting: Orthopedic Surgery

## 2013-01-20 VITALS — BP 118/78 | Ht 65.0 in | Wt 182.0 lb

## 2013-01-20 DIAGNOSIS — M722 Plantar fascial fibromatosis: Secondary | ICD-10-CM

## 2013-01-20 NOTE — Progress Notes (Signed)
Patient ID: Nancy George, female   DOB: 04/06/1946, 67 y.o.   MRN: [id number] Chief Complaint  Patient presents with  . Follow-up    3 week follow up right foot plantar fascitis    Right foot plantar fasciitis right foot/ankle Achilles tendinitis both have improved  The patient has had therapy, Cam Walker, injection and exercises at home  Review of systems negative  General appearance is normal, the patient is alert and oriented x3 with normal mood and affect. Ambulation is now normal. She has a little bit of tenderness at the plantar fascia Achilles nontender range of motion has improved to normal No neurovascular deficits  No diagnosis found. Plantar fasciitis Achilles tendinitis  Remove brace continue exercises and therapy followup as needed

## 2013-01-20 NOTE — Patient Instructions (Signed)
Continue therapy   Remove boot

## 2013-01-24 ENCOUNTER — Ambulatory Visit: Payer: BC Managed Care – PPO | Admitting: Physical Therapy

## 2013-01-26 ENCOUNTER — Ambulatory Visit: Payer: BC Managed Care – PPO | Admitting: Physical Therapy

## 2013-02-01 ENCOUNTER — Ambulatory Visit: Payer: BC Managed Care – PPO | Admitting: Physical Therapy

## 2013-02-03 ENCOUNTER — Ambulatory Visit: Payer: BC Managed Care – PPO | Admitting: Physical Therapy

## 2013-02-14 ENCOUNTER — Ambulatory Visit: Payer: BC Managed Care – PPO | Admitting: Physical Therapy

## 2013-02-17 ENCOUNTER — Ambulatory Visit: Payer: BC Managed Care – PPO | Attending: Orthopedic Surgery | Admitting: Physical Therapy

## 2013-02-17 DIAGNOSIS — R293 Abnormal posture: Secondary | ICD-10-CM | POA: Insufficient documentation

## 2013-02-17 DIAGNOSIS — M6281 Muscle weakness (generalized): Secondary | ICD-10-CM | POA: Insufficient documentation

## 2013-02-17 DIAGNOSIS — IMO0001 Reserved for inherently not codable concepts without codable children: Secondary | ICD-10-CM | POA: Insufficient documentation

## 2013-02-17 DIAGNOSIS — R269 Unspecified abnormalities of gait and mobility: Secondary | ICD-10-CM | POA: Insufficient documentation

## 2013-03-02 ENCOUNTER — Ambulatory Visit: Payer: BC Managed Care – PPO | Admitting: Physical Therapy

## 2013-03-08 ENCOUNTER — Ambulatory Visit (INDEPENDENT_AMBULATORY_CARE_PROVIDER_SITE_OTHER): Payer: BC Managed Care – PPO | Admitting: Family Medicine

## 2013-03-08 VITALS — BP 128/80 | Ht 65.0 in | Wt 179.0 lb

## 2013-03-08 DIAGNOSIS — M722 Plantar fascial fibromatosis: Secondary | ICD-10-CM

## 2013-03-08 NOTE — Patient Instructions (Signed)
Try the arch supports that we made for you. Stretch in the morning before getting out of bed Do plantar fascia exercises and hip exercises daily Followup in 6 weeks.

## 2013-03-09 NOTE — Progress Notes (Signed)
CC: Bilateral foot pain, right greater than left HPI: Patient is a pleasant 67 year old female who presents with approximately one year of plantar fasciitis symptoms. 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 She has tried multiple interventions without success to this point. These include one month of physical therapy, cushioned shoes, injection into the right plantar fascia, ice, plantar strap, stretching, night splint. Unfortunately, patient continues to be bothered by her symptoms and has not noted any significant improvement. She feels a pain under her heel that is sharp and is worse first thing in the morning.  ROS: As above in the HPI. All other systems are stable or negative.  PMH: Arthritis, high blood pressure   Social: Patient is an Environmental health practitioner. She does not smoke or use alcohol. Family: Family history is positive for high blood pressure in mother and sister. Negative for diabetes and heart disease.  Allergies: Penicillin    OBJECTIVE: APPEARANCE:  Patient in no acute distress.The patient appeared well nourished and normally developed. HEENT: No scleral icterus. Conjunctiva non-injected Resp: Non labored Skin: No rash MSK:  Foot exam:  - There is noted to be mild hallux valgus bilaterally.  - When standing, patient has pedis planus with over pronation. - With barefoot walking she is noted to pronate bilaterally.  - There is tenderness to palpation over the medial calcaneal attachment of the plantar fascia bilaterally but no other tenderness to palpation.  - Ankle range of motion is full without pain. - Strength is 5 out of 5 in ankle and foot.  - Neurovascular status normal. Patient is also noted to have genu valgus. Hip abduction strength 4+ out of 5.    ASSESSMENT: Bilateral plantar fasciitis, refractory to conservative measures thus far.   PLAN: At this point, I do think it  is reasonable to attempt a trial of modified shoe inserts to see if we can help the patient to really from her plantar fasciitis. Today she was fitted with support insoles with scaphoid pad. She was given a home exercise program with stretches and strengthening exercises for plantar fasciitis as well as strengthening of hip musculature with focus on hip abduction. We have asked her to discuss with her insurance company the cost of custom orthotics should she fail support insoles. She will followup in about 6 weeks and we will see how she is doing. If she is not proved consider custom orthotics at that time.

## 2013-04-26 ENCOUNTER — Ambulatory Visit: Payer: BC Managed Care – PPO | Admitting: Family Medicine

## 2014-07-03 ENCOUNTER — Ambulatory Visit (INDEPENDENT_AMBULATORY_CARE_PROVIDER_SITE_OTHER): Payer: BC Managed Care – PPO | Admitting: Orthopedic Surgery

## 2014-07-03 ENCOUNTER — Ambulatory Visit (INDEPENDENT_AMBULATORY_CARE_PROVIDER_SITE_OTHER): Payer: BC Managed Care – PPO

## 2014-07-03 ENCOUNTER — Encounter: Payer: Self-pay | Admitting: Orthopedic Surgery

## 2014-07-03 VITALS — BP 134/84 | Ht 65.0 in | Wt 185.0 lb

## 2014-07-03 DIAGNOSIS — M25561 Pain in right knee: Secondary | ICD-10-CM | POA: Insufficient documentation

## 2014-07-03 DIAGNOSIS — M25562 Pain in left knee: Secondary | ICD-10-CM

## 2014-07-03 NOTE — Patient Instructions (Signed)

## 2014-07-03 NOTE — Progress Notes (Signed)
Patient ID: Nancy George, female   DOB: 12/07/1945, 68 y.o.   MRN: [id number]  Chief Complaint  Patient presents with  . Follow-up    Bilateral knee pain.    The patient has chronic history of bilateral anterior knee pain and has had x-rays and the fascial femoral arthritis with normal tibiofemoral compartments  She presents with no aching but she does report pain when she's going up and down the steps and when she starts moving around she's not taking any medications at this time.  Review of systems denies numbness or tingling denies weakness or giving way  Medical history Past Medical History  Diagnosis Date  . HTN (hypertension)   . 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 Arthritis      BP 134/84 mmHg  Ht 5\' 5"  (1.651 m)  Wt 185 lb (83.915 kg)  BMI 30.79 kg/m2 She is meticulously groomed well-developed well-nourished grooming hygiene normal oriented 3 mood and affect normal ambulation normal  The right knee is more swollen than the left she has palpable tenderness and crepitance in the patellofemoral joint with mild tenderness in the medial compartment no tenderness in the lateral compartment is no joint effusion her knee flexion is 125 and her knee is stable in the coronal and sagittal planes with normal motor function. Distal skin is intact normal pulses are noted and there is no sensory deficit  Left knee is tender as well mildly over the medial compartment negative lateral compartment tenderness no effusion mild patellofemoral crepitance range of motion is full stability and strength are normal scans intact pulses are good and sensation is normal  X-ray interpretation patellofemoral arthritis with mild tibiofemoral compartment diffuse and equal narrowing without osteophytes or secondary bone changes  Impression bilateral anterior knee pain secondary to patellofemoral arthritis    Procedure note left knee injection verbal consent was obtained to inject left knee  joint  Timeout was completed to confirm the site of injection  The medications used were 40 mg of Depo-Medrol and 1% lidocaine 3 cc  Anesthesia was provided by ethyl chloride and the skin was prepped with alcohol.  After cleaning the skin with alcohol a 20-gauge needle was used to inject the left knee joint. There were no complications. A sterile bandage was applied.   Procedure note right knee injection verbal consent was obtained to inject right knee joint  Timeout was completed to confirm the site of injection  The medications used were 40 mg of Depo-Medrol and 1% lidocaine 3 cc  Anesthesia was provided by ethyl chloride and the skin was prepped with alcohol.  After cleaning the skin with alcohol a 20-gauge needle was used to inject the right knee joint. There were no complications. A sterile bandage was applied.

## 2015-01-29 ENCOUNTER — Other Ambulatory Visit: Payer: Self-pay | Admitting: Obstetrics & Gynecology

## 2015-01-30 LAB — CYTOLOGY - PAP

## 2015-04-17 ENCOUNTER — Ambulatory Visit (INDEPENDENT_AMBULATORY_CARE_PROVIDER_SITE_OTHER): Payer: BLUE CROSS/BLUE SHIELD | Admitting: Orthopedic Surgery

## 2015-04-17 ENCOUNTER — Encounter: Payer: Self-pay | Admitting: Orthopedic Surgery

## 2015-04-17 VITALS — BP 134/80 | Ht 65.0 in | Wt 186.0 lb

## 2015-04-17 DIAGNOSIS — M25561 Pain in right knee: Secondary | ICD-10-CM | POA: Diagnosis not present

## 2015-04-17 DIAGNOSIS — M25562 Pain in left knee: Secondary | ICD-10-CM

## 2015-04-17 NOTE — Progress Notes (Signed)
.  shfu Patient ID: Nancy George, female   DOB: 08/06/1946, 69 y.o.   MRN: [id number]  Follow up visit  Chief Complaint  Patient presents with  . Follow-up    follow up recurring bilateral knee pain    BP 134/80 mmHg  Ht  (1.651 m)  Wt 186 lb (84.369 kg)  BMI 30.95 kg/m2  Encounter Diagnosis  Name Primary?  Marland Kitchen Arthralgia of both knees Yes    Bilateral knee discomfort. Patient says her knees don't really hurt she has some discomfort when she's going up and down steps especially going down and getting out of a chair. She has a competition for the Mrs[employer] [place] contest coming up in October and I think at that point we will go ahead and inject her knees  As far as today goes she is having some back pain as well. She sees a Land. She's working on posture correction and sitting balance  The knee on inspection bilaterally shows no swelling but bilateral crepitance. Flexion is excellent. 125. Bilateral. Motor exam normal in each knee both knee stable.  Impression arthralgia both knees doesn't need knee replacement asked her to continue to keep active use the pool for therapy come back in October for injections prior to the [employer]

## 2015-04-17 NOTE — Patient Instructions (Signed)
Keep moving!

## 2015-05-22 ENCOUNTER — Ambulatory Visit: Payer: BLUE CROSS/BLUE SHIELD | Admitting: Orthopedic Surgery

## 2015-10-29 ENCOUNTER — Ambulatory Visit (INDEPENDENT_AMBULATORY_CARE_PROVIDER_SITE_OTHER): Payer: Medicare Other | Admitting: Orthopedic Surgery

## 2015-10-29 DIAGNOSIS — M25561 Pain in right knee: Secondary | ICD-10-CM

## 2015-10-29 DIAGNOSIS — M545 Low back pain, unspecified: Secondary | ICD-10-CM

## 2015-10-29 DIAGNOSIS — M25562 Pain in left knee: Secondary | ICD-10-CM | POA: Diagnosis not present

## 2015-10-29 NOTE — Progress Notes (Signed)
Patient ID: Nancy George, female   DOB: 09/26/1945, 70 y.o.   MRN: [id number]  Bilateral knee pain  HPI Nancy George is a 70 y.o. female.  Presents for evaluation of bilateral knee pain and some weakness in the right leg. History of bilateral patellofemoral arthritis.  Patient notes recent onset of clogging exercises and courses, has noted pain and difficulty elevating her right leg when she's getting in and out of a car   Review of Systems Review of Systems  Musculoskeletal: Positive for back pain.  Neurological: Negative for numbness.    Past Medical History  Diagnosis Date  . HTN (hypertension)   . 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 Arthritis     Past Surgical History  Procedure Laterality Date  . Appendectomy    . Tubal ligation    . Tonsillectomy    . Bartholin cyst marsupialization      Family History  Problem Relation Age of Onset  . Arthritis    . Colon cancer  53    half sister    Social History Social History  Substance Use Topics  . Smoking status: Never Smoker   . Smokeless tobacco: Never Used  . Alcohol Use: No    Allergies  Allergen Reactions  . Penicillins     Causes Yeast infection    Current Outpatient Prescriptions  Medication Sig Dispense Refill  . Cholecalciferol (VITAMIN D) 2000 UNITS CAPS Take by mouth daily.    Marland Kitchen. selenium 50 MCG TABS Take 50 mcg by mouth daily.    Marland Kitchen. triamterene-hydrochlorothiazide (MAXZIDE-25) 37.5-25 MG per tablet Take 1 tablet by mouth daily.    . TURMERIC CURCUMIN PO Take by mouth daily.    . Zinc 50 MG CAPS Take by mouth daily.     No current facility-administered medications for this visit.       Physical Exam Physical Exam There were no vitals taken for this visit. Appearance, there are no abnormalities in terms of appearance the patient was well-developed and well-nourished. The grooming and hygiene were normal.  Mental status orientation, there was normal alertness and orientation Mood  pleasant Ambulatory status normal with no assistive devices  Examination of the both knees  Inspection  Medial joint line tenderness , NO effusion. Range of motion flexion remains normal Tests for stability both knees remain stable Motor strength  normal hip flexors normal knee extenders  Skin warm dry and intact without laceration or ulceration or erythema Neurologic examination normal sensation Vascular examination normal pulses with warm extremity and normal capillary refill  Lumbar spine exam  Midline tenderness in the L4-5 region  There is tenderness on both sides of the L-spine   Data Reviewed  Previous x-rays show primarily patellofemoral arthrosis  Assessment   Primarily back pain.  Recommend physical therapy  Plan  PT L SPINE

## 2015-10-30 NOTE — Addendum Note (Signed)
Addended by: Debby BudLONG, Chirsty Armistead R on: 10/30/2015 02:28 PM   Modules accepted: Orders

## 2015-11-08 ENCOUNTER — Ambulatory Visit (INDEPENDENT_AMBULATORY_CARE_PROVIDER_SITE_OTHER): Payer: Medicare Other

## 2015-11-08 ENCOUNTER — Ambulatory Visit (INDEPENDENT_AMBULATORY_CARE_PROVIDER_SITE_OTHER): Payer: Medicare Other | Admitting: Orthopaedic Surgery

## 2015-11-08 VITALS — BP 166/97 | HR 64 | Temp 97.5°F | Ht 65.0 in | Wt 186.0 lb

## 2015-11-08 DIAGNOSIS — M25562 Pain in left knee: Secondary | ICD-10-CM | POA: Diagnosis not present

## 2015-11-08 NOTE — Patient Instructions (Signed)
We will schedule MRI for you and call you with appointment 

## 2015-11-09 ENCOUNTER — Encounter: Payer: Self-pay | Admitting: Orthopaedic Surgery

## 2015-11-09 NOTE — Progress Notes (Addendum)
Patient ZO:XWRUEAV:Nancy George, female DOB:11/12/1945, 70 y.o. WUJ:[id number]  Chief Complaint  Patient presents with  . Knee Pain    Bilateral knee pain    HPI  Nancy George is a 70 y.o. female who has knee pain, more on the left than the right.  She saw Dr. Romeo AppleHarrison here on 10/29/15.  Her left knee has gotten much worse.  She has more swelling, more pain and now the knee is giving way on her often.  She has no trauma, no redness.  She has no locking.  She is taking her medicine.  She could not get in to see him today or tomorrow and she was given an appointment to see me.    I have reviewed Dr. Mort SawyersHarrison's recent note.  HPI  Body mass index is 30.95 kg/(m^2).   Review of Systems  HENT: Negative for congestion.   Respiratory: Negative for shortness of breath.   Cardiovascular: Negative for chest pain.  Endocrine: Negative for cold intolerance.  Musculoskeletal: Positive for back pain, joint swelling, arthralgias and gait problem.  Neurological: Negative for numbness.    Past Medical History  Diagnosis Date  . HTN (hypertension)   . 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 Arthritis     Past Surgical History  Procedure Laterality Date  . Appendectomy    . Tubal ligation    . Tonsillectomy    . Bartholin cyst marsupialization      Family History  Problem Relation Age of Onset  . Arthritis    . Colon cancer  53    half sister    Social History Social History  Substance Use Topics  . Smoking status: Never Smoker   . Smokeless tobacco: Never Used  . Alcohol Use: No    Allergies  Allergen Reactions  . Penicillins     Causes Yeast infection    Current Outpatient Prescriptions  Medication Sig Dispense Refill  . Cholecalciferol (VITAMIN D) 2000 UNITS CAPS Take by mouth daily.    Marland Kitchen. selenium 50 MCG TABS Take 50 mcg by mouth daily.    Marland Kitchen. triamterene-hydrochlorothiazide (MAXZIDE-25) 37.5-25 MG per tablet Take 1 tablet by mouth daily.    . TURMERIC CURCUMIN PO Take by  mouth daily.    . Zinc 50 MG CAPS Take by mouth daily.     No current facility-administered medications for this visit.     Physical Exam  Blood pressure 166/97, pulse 64, temperature 97.5 F (36.4 C), height 5\' 5"  (1.651 m), weight 186 lb (84.369 kg).  Constitutional: overall normal hygiene, normal nutrition, well developed, normal grooming, normal body habitus. Assistive device:none  Musculoskeletal: gait and station Limp left, muscle tone and strength are normal, no tremors or atrophy is present.  .  Neurological: coordination overall normal.  Deep tendon reflex/nerve stretch intact.  Sensation normal.  Cranial nerves II-XII intact.   Skin:   normal overall no scars, lesions, ulcers or rashes. No psoriasis.  Psychiatric: Alert and oriented x 3.  Recent memory intact, remote memory unclear.  Normal mood and affect. Well groomed.  Good eye contact.  Cardiovascular: overall no swelling, no varicosities, no edema bilaterally, normal temperatures of the legs and arms, no clubbing, cyanosis and good capillary refill.  Lymphatic: palpation is normal.  The left lower extremity is examined:  Inspection:  Thigh:  Non-tender and no defects  Knee has swelling 2+ effusion.  Joint tenderness is present                        Patient is tender over the medial joint line  Lower Leg:  Has normal appearance and no tenderness or defects  Ankle:  Non-tender and no defects  Foot:  Non-tender and no defects Range of Motion:  Knee:  Range of motion is: 0-105                        Crepitus is  present  Ankle:  Range of motion is normal. Strength and Tone:  The left lower extremity has normal strength and tone. Stability:  Knee:  The knee is stable.  Ankle:  The ankle is stable.   Additional services performed: x-ray of the left knee done  The patient has been educated about the nature of the problem(s) and counseled on treatment options.  The patient appeared to  understand what I have discussed and is in agreement with it.  Encounter Diagnosis  Name Primary?  . Left knee pain Yes   PROCEDURE NOTE:  The patient request injection, verbal consent was obtained.  Theleft knee was prepped appropriately after time out was performed.   Sterile technique was observed and injection of 1 cc of Depo-Medrol 40 mg with several cc's of plain xylocaine. Anesthesia was provided by ethyl chloride and a 20-gauge needle was used to inject the knee area. The injection was tolerated well.  A band aid dressing was applied.  The patient was advised to apply ice later today and tomorrow to the injection sight as needed.  PLAN Call if any problems.  Precautions discussed.  Continue current medications.   Return to clinic after MRI of the left knee.  I am concerned about meniscus tear.

## 2015-11-13 ENCOUNTER — Ambulatory Visit: Payer: Medicare Other

## 2015-11-13 NOTE — Addendum Note (Signed)
Addended by: Earnstine RegalKEELING, JOHN W on: 11/13/2015 03:53 PM   Modules accepted: Kipp BroodSmartSet

## 2015-11-20 ENCOUNTER — Ambulatory Visit
Admission: RE | Admit: 2015-11-20 | Discharge: 2015-11-20 | Disposition: A | Discharge status: Home / Self Care | Payer: Medicare Other | Source: Ambulatory Visit | Attending: Orthopaedic Surgery | Admitting: Orthopaedic Surgery

## 2015-11-20 DIAGNOSIS — M25562 Pain in left knee: Secondary | ICD-10-CM

## 2015-11-22 ENCOUNTER — Ambulatory Visit (INDEPENDENT_AMBULATORY_CARE_PROVIDER_SITE_OTHER): Payer: Medicare Other | Admitting: Orthopaedic Surgery

## 2015-11-22 ENCOUNTER — Encounter: Payer: Self-pay | Admitting: Orthopaedic Surgery

## 2015-11-22 ENCOUNTER — Ambulatory Visit: Payer: Medicare Other | Admitting: Orthopedic Surgery

## 2015-11-22 VITALS — BP 151/93 | HR 70 | Temp 97.5°F | Resp 16 | Ht 65.0 in | Wt 186.0 lb

## 2015-11-22 DIAGNOSIS — M25562 Pain in left knee: Secondary | ICD-10-CM

## 2015-11-22 NOTE — Progress Notes (Signed)
Patient Nancy George Nancy George, female DOB:01/02/1946, 70 y.o. FAO:[id number]  No chief complaint on file.   HPI  Nancy George is a 70 y.o. female who comes in for review of the MRI of the left knee.  She continues to have pain in the left knee that is not improving.  She has popping and swelling   She has limited motion at times.  MRI showed: IMPRESSION: 1. Tricompartmental cartilage abnormalities most severe in the patellofemoral compartment as described above. 2. Mild soft tissue edema superficial to the MCL which may reflect mild MCL strain. MCL is otherwise intact. 3. Small tear along the superior surface of the body of the lateral Meniscus.  I have gone over the MRI with the patient and her husband.  He is post bilateral total knees.  I have told her she is a candidate for a total knee on the left.  She says she needs to wait.  She wants to lose weight.  She understands that something will need to be done in the future.  HPI  Body mass index is 30.95 kg/(m^2).  Review of Systems  HENT: Negative for congestion.   Respiratory: Negative for shortness of breath.   Cardiovascular: Negative for chest pain.  Endocrine: Negative for cold intolerance.  Musculoskeletal: Positive for back pain, joint swelling, arthralgias and gait problem.  Neurological: Negative for numbness.    Past Medical History  Diagnosis Date  . HTN (hypertension)   . 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 Arthritis     Past Surgical History  Procedure Laterality Date  . Appendectomy    . Tubal ligation    . Tonsillectomy    . Bartholin cyst marsupialization      Family History  Problem Relation Age of Onset  . Arthritis    . Colon cancer  53    half sister    Social History Social History  Substance Use Topics  . Smoking status: Never Smoker   . Smokeless tobacco: Never Used  . Alcohol Use: No    Allergies  Allergen Reactions  . Penicillins     Causes Yeast infection    Current  Outpatient Prescriptions  Medication Sig Dispense Refill  . Cholecalciferol (VITAMIN D) 2000 UNITS CAPS Take by mouth daily.    Marland Kitchen selenium 50 MCG TABS Take 50 mcg by mouth daily.    Marland Kitchen triamterene-hydrochlorothiazide (MAXZIDE-25) 37.5-25 MG per tablet Take 1 tablet by mouth daily.    . TURMERIC CURCUMIN PO Take by mouth daily.    . Zinc 50 MG CAPS Take by mouth daily.     No current facility-administered medications for this visit.     Physical Exam  Blood pressure 151/93, pulse 70, temperature 97.5 F (36.4 C), resp. rate 16, height  (1.651 m), weight 186 lb (84.369 kg).  Constitutional: overall normal hygiene, normal nutrition, well developed, normal grooming, normal body habitus. Assistive device:none  Musculoskeletal: gait and station Limp left, muscle tone and strength are normal, no tremors or atrophy is present.  .  Neurological: coordination overall normal.  Deep tendon reflex/nerve stretch intact.  Sensation normal.  Cranial nerves II-XII intact.   Skin:   normal overall no scars, lesions, ulcers or rashes. No psoriasis.  Psychiatric: Alert and oriented x 3.  Recent memory intact, remote memory unclear.  Normal mood and affect. Well groomed.  Good eye contact.  Cardiovascular: overall no swelling, no varicosities, no edema bilaterally, normal temperatures of the legs and arms, no clubbing, cyanosis and good  capillary refill.  Lymphatic: palpation is normal.  The left lower extremity is examined:  Inspection:  Thigh:  Non-tender and no defects  Knee has swelling 1+ effusion.                        Joint tenderness is present                        Patient is tender over the medial joint line  Lower Leg:  Has normal appearance and no tenderness or defects  Ankle:  Non-tender and no defects  Foot:  Non-tender and no defects Range of Motion:  Knee:  Range of motion is: 0-105                        Crepitus is  present  Ankle:  Range of motion is normal. Strength  and Tone:  The left lower extremity has normal strength and tone. Stability:  Knee:  The knee is stable.  Ankle:  The ankle is stable.  The patient has been educated about the nature of the problem(s) and counseled on treatment options.  The patient appeared to understand what I have discussed and is in agreement with it.  Encounter Diagnosis  Name Primary?  . Left knee pain Yes    PLAN Call if any problems.  Precautions discussed.  Continue current medications.   Return to clinic as needed.  Consider total knee arthroplasty. I will see as needed.  Dr. Romeo AppleHarrison is her primary orthopedist.

## 2015-11-23 ENCOUNTER — Ambulatory Visit: Payer: Medicare Other | Admitting: Orthopedic Surgery

## 2015-12-17 ENCOUNTER — Ambulatory Visit (INDEPENDENT_AMBULATORY_CARE_PROVIDER_SITE_OTHER): Payer: Medicare Other | Admitting: Orthopedic Surgery

## 2015-12-17 ENCOUNTER — Encounter: Payer: Self-pay | Admitting: Orthopedic Surgery

## 2015-12-17 VITALS — BP 134/67 | Ht 65.0 in | Wt 193.0 lb

## 2015-12-17 DIAGNOSIS — M25562 Pain in left knee: Secondary | ICD-10-CM

## 2015-12-17 DIAGNOSIS — M25561 Pain in right knee: Secondary | ICD-10-CM

## 2015-12-17 NOTE — Patient Instructions (Signed)
CALL [facility] LOC[facility] ON [address] TO SCHEDULE THERAPY

## 2015-12-19 NOTE — Progress Notes (Signed)
Patient ID: Nancy GermanRebecca A Desmith, female   DOB: 03/16/1946, 70 y.o.   MRN: [id number]  Chief Complaint  Patient presents with  . Follow-up    FOLLOW UP LEFT KNEE PAIN    HPI  70 year old female follow-up left knee last MRI showed severe arthritis in the knee Possibility of knee replacement to be discussed.  I reviewed her symptoms and basically she has trouble getting out of a chair and trouble climbing the steps especially with the right leg she came in today saying she wanted right knee to be replaced however, she is not having any pain she is ambulating normally on flat surfaces. She does not have catching locking or giving way. ROS  BP 134/67 mmHg  Ht 5\' 5"  (1.651 m)  Wt 193 lb (87.544 kg)  BMI 32.12 kg/m2  Physical Exam  Ortho Exam   the right knee has crepitance in the patellofemoral joint but she still has good flexion and the knee the knee remains stable she's neurovascularly intact with good distal pulses no edema normal sensation is good strength in the quadriceps and no instability  The left knee is tender over the medial joint line a little some crepitance full range of motion normal stability strength skin. No edema. Sensation normal.   ASSESSMENT AND PLAN    BILATERAL PATELLOFEMORAL ARTHRITIS  INJECT RIGHT KNEE  Procedure note right knee injection verbal consent was obtained to inject right knee joint  Timeout was completed to confirm the site of injection  The medications used were 40 mg of Depo-Medrol and 1% lidocaine 3 cc  Anesthesia was provided by ethyl chloride and the skin was prepped with alcohol.  After cleaning the skin with alcohol a 20-gauge needle was used to inject the right knee joint. There were no complications. A sterile bandage was applied.

## 2015-12-26 ENCOUNTER — Ambulatory Visit: Payer: Medicare Other | Attending: Family Medicine | Admitting: Physical Therapy

## 2015-12-26 DIAGNOSIS — R262 Difficulty in walking, not elsewhere classified: Secondary | ICD-10-CM | POA: Diagnosis present

## 2015-12-26 DIAGNOSIS — M6281 Muscle weakness (generalized): Secondary | ICD-10-CM | POA: Insufficient documentation

## 2015-12-26 DIAGNOSIS — M25661 Stiffness of right knee, not elsewhere classified: Secondary | ICD-10-CM | POA: Diagnosis present

## 2015-12-26 DIAGNOSIS — M25662 Stiffness of left knee, not elsewhere classified: Secondary | ICD-10-CM

## 2015-12-26 NOTE — Patient Instructions (Signed)
Quad Set    With other leg bent, foot flat, slowly tighten muscles on thigh of straight leg while counting out loud to  5-10 sec  Repeat with other leg. Repeat __10__ times. Do ___3_ sessions per day.     http://gt2.exer.us/276   Copyright  VHI. All rights reserved.    Abduction: Side Leg Lift (Eccentric) - Side-Lying    Lie on side. Lift top leg slightly higher than shoulder level. Keep top leg straight with body, toes pointing forward. Slowly lower for 3-5 seconds. _10__ reps per set, __1-2_ sets per day, __5_ days per week.  http://ecce.exer.us/63   Copyright  VHI. All rights reserved.      Supporting right thigh behind knee, slowly straighten knee until stretch is felt in back of thigh. USE A SHEET OR TOWEL LOOPED AROUND THE ARCH OF YOUR FOOT> Hold _30___ seconds. Repeat _2-3___ times per set. Do _1___ sets per session. Do __2__ sessions per day.  http://orth.exer.us/657

## 2015-12-27 NOTE — Therapy (Signed)
[facility] [address] [place], [place], [address] Phone: [phone]   Fax:  [phone]  Physical Therapy Treatment  Patient Details  Name: Nancy George MRN: [id number] Date of Birth: 02-Oct-1945 Referring Provider: Dr. Janann Colonel  Encounter Date: 12/26/2015      PT End of Session - 12/26/15 1339    Visit Number 1   Number of Visits 8   Date for PT Re-Evaluation 01/25/16   PT Start Time 0848   PT Stop Time 0929   PT Time Calculation (min) 41 min   Activity Tolerance Patient tolerated treatment well      Past Medical History  Diagnosis Date  . HTN (hypertension)   . 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 Arthritis     Past Surgical History  Procedure Laterality Date  . Appendectomy    . Tubal ligation    . Tonsillectomy    . Bartholin cyst marsupialization      There were no vitals filed for this visit.      Subjective Assessment - 12/26/15 0850    Subjective Pt went to see MD in March for L knee pain. Had MRI.  Recently visited MD for both knees.  She has pain with stairs and getting up from a chair.  Min weakness, occ has buckling in each leg.  Rt. knee occ locks up on her.  She has no increase in pain with walking in communtiy or in her home.     Pertinent History OA, back pain, bursitis   Limitations Standing;Walking;Other (comment);Sitting  stairs   How long can you sit comfortably? difficulty getting up from a chair, knee pain resolves quickly.    How long can you stand comfortably? as needed, up to an hour   How long can you walk comfortably? up to 30 min for fitness    Diagnostic tests MRI "bone on bone"    Patient Stated Goals prevent/delay surgery   Currently in Pain? No/denies  at rest   Pain Score 5   with stairs    Pain Location Knee   Pain Orientation Right;Left;Anterior   Pain Descriptors / Indicators Sharp   Pain Type Chronic pain   Pain Onset More than a month ago   Pain  Frequency Intermittent   Pain Relieving Factors cortisone shot helped, otherwise she really does not have pain in her knees.    Effect of Pain on Daily Activities mobility can be a challenge but she manage            [facility] PT Assessment - 12/26/15 0855    Assessment   Medical Diagnosis bilateral knee pain    Referring Provider Dr. Janann Colonel   Onset Date/Surgical Date --  chronic    Next MD Visit unknown    Prior Therapy yes but not for knees    Precautions   Precautions None   Restrictions   Weight Bearing Restrictions No   Balance Screen   Has the patient fallen in the past 6 months No   Has the patient had a decrease in activity level because of a fear of falling?  No   Is the patient reluctant to leave their home because of a fear of falling?  No   Home Environment   Living Environment Private residence   Living Arrangements Spouse/significant other   Type of Home House   Home Access Stairs to enter   Entrance Stairs-Number of Steps 2   Entrance Stairs-Rails None  Home Layout Two level;Able to live on main level with bedroom/bathroom   Prior Function   Level of Independence Independent   Vocation Part time employment   Cognition   Overall Cognitive Status Within Functional Limits for tasks assessed   Observation/Other Assessments   Focus on Therapeutic Outcomes (FOTO)  48%   Sensation   Light Touch Appears Intact   Coordination   Gross Motor Movements are Fluid and Coordinated No   Posture/Postural Control   Posture/Postural Control Postural limitations   Posture Comments genu valgus   AROM   Right Knee Extension -5   Right Knee Flexion 123   Left Knee Extension 0   Left Knee Flexion 125   Strength   Right Hip Flexion 4/5   Right Hip Extension 4/5   Right Hip ABduction 4-/5   Left Hip Flexion 4/5   Left Hip Extension 4/5   Left Hip ABduction 4/5   Right Knee Flexion 4+/5   Right Knee Extension 4+/5   Left Knee Flexion 4+/5   Left Knee Extension  4/5   Palpation   Patella mobility crepitus, decent mobility, tender    Palpation comment sore Rt. medial joint line    Ambulation/Gait   Ambulation Distance (Feet) 300 Feet   Assistive device None   Gait Pattern Step-through pattern;Decreased hip/knee flexion - right;Decreased hip/knee flexion - left;Antalgic   Ambulation Surface Level;Indoor           PT Education - 12/26/15 1339    Education provided Yes   Education Details PT/POC, HEP, quad strength   Person(s) Educated Patient   Methods Explanation;Demonstration;Handout   Comprehension Verbalized understanding;Returned demonstration;Need further instruction             PT Long Term Goals - 12/27/15 1002    PT LONG TERM GOAL #1   Title Ptwill be I with HEP for core, hip and knee strength   Time 4   Period Weeks   Status New   PT LONG TERM GOAL #2   Title Pt will be able to report only min difficulty, pain <2/10 when transitioning from sit to stand.    Time 4   Period Weeks   Status New   PT LONG TERM GOAL #3   Title Pt will do stairs with less pain (25% less overall) reciprocally with 1 UE rail.    Time 4   Period Weeks   Status New   PT LONG TERM GOAL #4   Title Pt will score  FOTO less than 37% limited to demo improved function.    Time 4   Period Weeks   Status New               Plan - 12/27/15 [id number]    Clinical Impression Statement Pt presents for low complexity evalutation on billateral knee pain which has been chronic.  She did notice pain back in March, since then her pain has improved, she only has pain with stairs and transitions to standing.  She would like to avoid or at least postpone surgery and learn safe knee exercises to improve function and strength.     Rehab Potential Excellent   Clinical Impairments Affecting Rehab Potential vertigo   PT Frequency 2x / week   PT Duration 4 weeks   PT Treatment/Interventions ADLs/Self Care Home Management;Ultrasound;Neuromuscular  re-education;Patient/family education;Gait training;Electrical Stimulation;Functional mobility training;Stair training;Manual techniques;Therapeutic exercise;Moist Heat;Therapeutic activities;Vasopneumatic Device;Taping;Cryotherapy   PT Next Visit Plan review and progress HEP, patellar mobs, soft tissue  PT Home Exercise Plan level 1 -2 knee: SLR (hip flex, abd) and bridging, quad set    Consulted and Agree with Plan of Care Patient      Patient will benefit from skilled therapeutic intervention in order to improve the following deficits and impairments:  Hypomobility, Decreased range of motion, Impaired flexibility, Decreased strength, Decreased mobility, Abnormal gait  Visit Diagnosis: Difficulty in walking, not elsewhere classified  Muscle weakness (generalized)  Stiffness of left knee, not elsewhere classified  Stiffness of right knee, not elsewhere classified       G-Codes - 2015/12/29 1058    Functional Assessment Tool Used FOTO   Functional Limitation Mobility: Walking and moving around   Mobility: Walking and Moving Around Current Status [phone]) At least 40 percent but less than 60 percent impaired, limited or restricted   Mobility: Walking and Moving Around Goal Status [phone]) At least 20 percent but less than 40 percent impaired, limited or restricted      Problem List Patient Active Problem List   Diagnosis Date Noted  . Arthralgia of both knees 07/03/2014  . Achilles bursitis or tendinitis 12/30/2012  . Plantar fascia syndrome 11/18/2012  . Cervical spondylosis 09/21/2012  . Cervical spondylolysis 09/21/2012  . 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 BACK PAIN 04/15/2010  . Osteoarthrosis, unspecified whether generalized or localized, lower leg 05/01/2009  . KNEE PAIN 02/26/2009  . DISC DEGENERATION 03/27/2008  . HIP PAIN 02/23/2008  . BURSITIS, RIGHT HIP 02/23/2008    Keyen Marban Dec 29, 2015, 10:59 AM  [facility] [address] [place], [place], [address] Phone: [phone]   Fax:  [phone]  Name: RUBA OUTEN MRN: [id number] Date of Birth: June 27, 1946    Karie Mainland, PT 12-29-15 10:59 AM Phone: [phone] Fax: [phone]

## 2016-01-01 ENCOUNTER — Ambulatory Visit: Payer: Medicare Other | Admitting: Physical Therapy

## 2016-01-01 DIAGNOSIS — R262 Difficulty in walking, not elsewhere classified: Secondary | ICD-10-CM

## 2016-01-01 DIAGNOSIS — M6281 Muscle weakness (generalized): Secondary | ICD-10-CM

## 2016-01-01 DIAGNOSIS — M25662 Stiffness of left knee, not elsewhere classified: Secondary | ICD-10-CM

## 2016-01-01 DIAGNOSIS — M25661 Stiffness of right knee, not elsewhere classified: Secondary | ICD-10-CM

## 2016-01-01 NOTE — Therapy (Signed)
[place], [place], [address] Phone: [phone]   Fax:  [phone]  Physical Therapy Treatment  Patient Details  Name: Nancy George MRN: [id number] Date of Birth: Jun 29, 1946 Referring Provider: Dr. Dyane Dustman  Encounter Date: 01/01/2016      PT End of Session - 01/01/16 0841    Visit Number 2   Number of Visits 8   Date for PT Re-Evaluation 01/25/16   PT Start Time 0730   PT Stop Time 0803   PT Time Calculation (min) 33 min   Activity Tolerance Patient tolerated treatment well;No increased pain   Behavior During Therapy [facility] for tasks assessed/performed      Past Medical History  Diagnosis Date  . HTN (hypertension)   . 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 Arthritis     Past Surgical History  Procedure Laterality Date  . Appendectomy    . Tubal ligation    . Tonsillectomy    . Bartholin cyst marsupialization      There were no vitals filed for this visit.      Subjective Assessment - 01/01/16 0807    Subjective No pain this morning.  I don't feel like I get a good contraction with the quad sets.    Currently in Pain? No/denies   Pain Score 5    Pain Location Knee   Pain Orientation Right;Left;Anterior   Pain Descriptors / Indicators Sharp;Aching   Pain Frequency Intermittent   Pain Relieving Factors rest   Effect of Pain on Daily Activities pain wakes her up at night                         [facility] Adult PT Treatment/Exercise - 01/01/16 0735    Self-Care   Self-Care ADL's;Other Self-Care Comments   Other Self-Care Comments  discussed exercises to do at gym, shoes    Knee/Hip Exercises: Stretches   Passive Hamstring Stretch 2 reps;30 seconds  each   Knee/Hip Exercises: Supine   Quad Sets 10 reps  poor contraction, not painful,  cues   Manual Therapy   Manual therapy comments retrograde soft tissue work both legs elevated,  medial hamstrings tight and  sore,, quads congested and tight right >left.  tissue softened.  Patellar mobilization both stiff initially                 PT Education - 01/01/16 0840    Education provided Yes   Education Details shoes to wear, things to do at the gym   Person(s) Educated Patient   Methods Explanation   Comprehension Verbalized understanding             PT Long Term Goals - 01/01/16 0843    PT LONG TERM GOAL #1   Title Ptwill be I with HEP for core, hip and knee strength   Baseline needs cues   Time 4   Period Weeks   Status On-going   PT LONG TERM GOAL #2   Title Pt will be able to report only min difficulty, pain <2/10 when transitioning from sit to stand.    Time 4   Period Weeks   Status On-going   PT LONG TERM GOAL #3   Title Pt will do stairs with less pain (25% less overall) reciprocally with 1 UE rail.    Baseline no change yet   Time 4   Period Weeks   Status On-going   PT LONG TERM  GOAL #4   Title Pt will score  FOTO less than 37% limited to demo improved function.    Time 4   Period Weeks   Status Unable to assess               Plan - 01/01/16 0841    Clinical Impression Statement Patient more comfortable post session,  quad contraction poor and guarded.  No new goals met   PT Next Visit Plan continiue quad strengthening.    PT Home Exercise Plan level 1 -2 knee: SLR (hip flex, abd) and bridging, quad set    Consulted and Agree with Plan of Care Patient      Patient will benefit from skilled therapeutic intervention in order to improve the following deficits and impairments:  Hypomobility, Decreased range of motion, Impaired flexibility, Decreased strength, Decreased mobility, Abnormal gait  Visit Diagnosis: Difficulty in walking, not elsewhere classified  Muscle weakness (generalized)  Stiffness of left knee, not elsewhere classified  Stiffness of right knee, not elsewhere classified     Problem List Patient Active Problem List    Diagnosis Date Noted  . Arthralgia of both knees 07/03/2014  . Achilles bursitis or tendinitis 12/30/2012  . Plantar fascia syndrome 11/18/2012  . Cervical spondylosis 09/21/2012  . Cervical spondylolysis 09/21/2012  . 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 BACK PAIN 04/15/2010  . Osteoarthrosis, unspecified whether generalized or localized, lower leg 05/01/2009  . KNEE PAIN 02/26/2009  . [place] DEGENERATION 03/27/2008  . HIP PAIN 02/23/2008  . BURSITIS, RIGHT HIP 02/23/2008    [facility] 01/01/2016, 8:44 AM  [facility] [address] [place], [place], [address] Phone: [phone]   Fax:  [phone]  Name: FAATIMA TENCH MRN: [id number] Date of Birth: [birth year]    Melvenia Needles, PTA 01/01/2016 8:44 AM Phone: [phone] Fax: [phone]

## 2016-01-03 ENCOUNTER — Ambulatory Visit: Payer: Medicare Other

## 2016-01-03 DIAGNOSIS — M25661 Stiffness of right knee, not elsewhere classified: Secondary | ICD-10-CM

## 2016-01-03 DIAGNOSIS — R262 Difficulty in walking, not elsewhere classified: Secondary | ICD-10-CM | POA: Diagnosis not present

## 2016-01-03 DIAGNOSIS — M6281 Muscle weakness (generalized): Secondary | ICD-10-CM

## 2016-01-03 DIAGNOSIS — M25662 Stiffness of left knee, not elsewhere classified: Secondary | ICD-10-CM

## 2016-01-03 NOTE — Patient Instructions (Signed)
Hip Extension: Hamstring Single Leg Deadlift (Eccentric)   Holding weights, stand on affected leg with knee slightly flexed. Lift other leg while slowly bending forward at the hip. Use ___ lb weight. ___ reps per set, ___ sets per day, ___ days per week. Add ___ lbs when you achieve ___ repetitions. Touch floor with weight.  Copyright  VHI. All rights reserved.  Squat: Wide Leg   Feet wide apart, toes out, squat, holding weight between knees. Weight should be at ankles.  Bend at the hips with hips going back behind hips. Keep back straight.  Put eight on stool if you cannot get to floor.  Repeat _3-15___ times per set. Do ___1-2_ sets per session. Do __2-5__ sessions per week. Use __0-10__ lb weight.  Copyright  VHI. All rights reserved.  Squat: Half   Arms hanging at sides, squat by dropping hips back as if sitting on a chair. Keep knees over ankles, hips behind heels.  Keep back straight.  Bend at hips and knees will bend with hips.   Repeat ___3-15_ times per set. Do __1-2__ sets per session. Do __2-5__ sessions per week. Use __0-10HIP / KNEE: Flexion / Extension, Squat Unilateral Hip / Glute Extension: Standing - Straight Leg (Machine) Hip Extension (Standing) Healthy Back - Yoga Tree Balance  

## 2016-01-03 NOTE — Therapy (Signed)
[facility] [address] [place], [place], [address] Phone: [phone]   Fax:  [phone]  Physical Therapy Treatment  Patient Details  Name: Nancy George MRN: [id number] Date of Birth: Jul 15, 1946 Referring Provider: Dr. Janann Colonel  Encounter Date: 01/03/2016      PT End of Session - 01/03/16 0808    Visit Number 3   Number of Visits 8   Date for PT Re-Evaluation 01/25/16   PT Start Time 0800   PT Stop Time 0840   PT Time Calculation (min) 40 min   Activity Tolerance Patient tolerated treatment well   Behavior During Therapy [facility] for tasks assessed/performed      Past Medical History  Diagnosis Date  . HTN (hypertension)   . 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 Arthritis     Past Surgical History  Procedure Laterality Date  . Appendectomy    . Tubal ligation    . Tonsillectomy    . Bartholin cyst marsupialization      There were no vitals filed for this visit.      Subjective Assessment - 01/03/16 0808    Subjective No pain today.    Currently in Pain? No/denies                         [facility] Adult PT Treatment/Exercise - 01/03/16 0817    Knee/Hip Exercises: Stretches   Passive Hamstring Stretch 2 reps;30 seconds   Passive Hamstring Stretch Limitations cued for time and reasome for time length and keeping opposit leg down to stretch hip flexors   Knee/Hip Exercises: Aerobic   Stationary Bike L2 5 min   Knee/Hip Exercises: Sidelying   Hip ABduction Strengthening;Right;Left;10 reps   Hip ABduction Limitations cues to stabilize pelvis and to reach leg     Spent most of session with education on hip hinge exercise and benefits and technique for more engagement of hips to ease stress  to knees.  She did multiple reps with good technique after education. We did single and double leg.   Also we used isometric quads with toe under chair with push up into bottom of chair to incr quad  contraction . She reported better awareness of quad contraction.           PT Education - 01/03/16 0848    Education provided Yes   Education Details single and double hip hinge   Person(s) Educated Patient   Methods Explanation;Demonstration;Tactile cues;Verbal cues;Handout;Other (comment)  internet youtube video   Comprehension Returned demonstration;Verbalized understanding             PT Long Term Goals - 01/01/16 0843    PT LONG TERM GOAL #1   Title Ptwill be I with HEP for core, hip and knee strength   Baseline needs cues   Time 4   Period Weeks   Status On-going   PT LONG TERM GOAL #2   Title Pt will be able to report only min difficulty, pain <2/10 when transitioning from sit to stand.    Time 4   Period Weeks   Status On-going   PT LONG TERM GOAL #3   Title Pt will do stairs with less pain (25% less overall) reciprocally with 1 UE rail.    Baseline no change yet   Time 4   Period Weeks   Status On-going   PT LONG TERM GOAL #4   Title Pt will score  FOTO less than 37%  limited to demo improved function.    Time 4   Period Weeks   Status Unable to assess               Plan - 01/03/16 0809    Clinical Impression Statement No pain today and able to do HEP correctly but added some stabilization at pelvis  and did hip hinge well post instruction. Continue strnegth   PT Next Visit Plan continiue quad strengthening.    Consulted and Agree with Plan of Care Patient      Patient will benefit from skilled therapeutic intervention in order to improve the following deficits and impairments:  Hypomobility, Decreased range of motion, Impaired flexibility, Decreased strength, Decreased mobility, Abnormal gait  Visit Diagnosis: Difficulty in walking, not elsewhere classified  Muscle weakness (generalized)  Stiffness of left knee, not elsewhere classified  Stiffness of right knee, not elsewhere classified     Problem List Patient Active Problem List    Diagnosis Date Noted  . Arthralgia of both knees 07/03/2014  . Achilles bursitis or tendinitis 12/30/2012  . Plantar fascia syndrome 11/18/2012  . Cervical spondylosis 09/21/2012  . Cervical spondylolysis 09/21/2012  . 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 BACK PAIN 04/15/2010  . Osteoarthrosis, unspecified whether generalized or localized, lower leg 05/01/2009  . KNEE PAIN 02/26/2009  . DISC DEGENERATION 03/27/2008  . HIP PAIN 02/23/2008  . Cherylann BanasBURSITIS, RIGHT HIP 02/23/2008    Caprice RedChasse, Melanie Openshaw M PT 01/03/2016, 8:52 AM  [facility] [address] [place], [place], [address] Phone: [phone]   Fax:  [phone]  Name: Nancy George MRN: [id number] Date of Birth: 12/17/1945

## 2016-01-07 ENCOUNTER — Ambulatory Visit: Payer: Medicare Other | Admitting: Physical Therapy

## 2016-01-09 ENCOUNTER — Ambulatory Visit: Payer: Medicare Other | Admitting: Physical Therapy

## 2016-01-09 DIAGNOSIS — M25662 Stiffness of left knee, not elsewhere classified: Secondary | ICD-10-CM

## 2016-01-09 DIAGNOSIS — R262 Difficulty in walking, not elsewhere classified: Secondary | ICD-10-CM

## 2016-01-09 DIAGNOSIS — M6281 Muscle weakness (generalized): Secondary | ICD-10-CM

## 2016-01-09 DIAGNOSIS — M25661 Stiffness of right knee, not elsewhere classified: Secondary | ICD-10-CM

## 2016-01-09 NOTE — Therapy (Signed)
[place], [place], [address] Phone: [phone]   Fax:  [phone]  Physical Therapy Treatment  Patient Details  Name: Nancy George MRN: [id number] Date of Birth: [birth year] Referring Provider: Dr. Dyane Dustman  Encounter Date: 01/09/2016      PT End of Session - 01/09/16 1756    Visit Number 4   Number of Visits 8   Date for PT Re-Evaluation 01/25/16   PT Start Time 1503   PT Stop Time 1545   PT Time Calculation (min) 42 min   Activity Tolerance Patient tolerated treatment well   Behavior During Therapy [facility] for tasks assessed/performed      Past Medical History  Diagnosis Date  . HTN (hypertension)   . 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 Arthritis     Past Surgical History  Procedure Laterality Date  . Appendectomy    . Tubal ligation    . Tonsillectomy    . Bartholin cyst marsupialization      There were no vitals filed for this visit.      Subjective Assessment - 01/09/16 1505    Subjective No pain.  Going tho the gym.  Able to go up steps step over step  since Monday,  For the first time in awhile.     Currently in Pain? No/denies   Pain Relieving Factors exercises                         [facility] Adult PT Treatment/Exercise - 01/09/16 0001    Knee/Hip Exercises: Stretches   Passive Hamstring Stretch 3 reps;30 seconds   Knee/Hip Exercises: Standing   Forward Step Up Both;1 set;10 reps;Hand Hold: 2;Step Height: 6"   Forward Step Up Limitations cues   Step Down Both;1 set;10 reps;Hand Hold: 1;Step Height: 4"   Step Down Limitations mild discomfort   Stairs 4 and 6 inck step over step, rails and extra time 2 sets each   Other Standing Knee Exercises hip hinge,  with and without kettlebell 5 pounds on 4 inch platform.     Knee/Hip Exercises: Seated   Long Arc Quad 5 reps  5 pounds, 10 second holds   Sit to [employer] 5 reps   Knee/Hip Exercises: Sidelying   Hip ABduction Strengthening   Hip ABduction Limitations 10 X 5-10 seconds  each                     PT Long Term Goals - 01/09/16 1758    PT LONG TERM GOAL #1   Title Ptwill be I with HEP for core, hip and knee strength   Baseline independent so far   Time 4   Period Weeks   Status On-going   PT LONG TERM GOAL #2   Title Pt will be able to report only min difficulty, pain <2/10 when transitioning from sit to stand.    Baseline able to get up a lot better, pain not assessed   Time 4   Period Weeks   Status Partially Met   PT LONG TERM GOAL #3   Title Pt will do stairs with less pain (25% less overall) reciprocally with 1 UE rail.    Baseline uses 2 rails less pain   Time 4   Period Weeks   Status Partially Met   PT LONG TERM GOAL #4   Title Pt will score  FOTO less than 37% limited to demo improved function.  Time 4   Period Weeks   Status Unable to assess               Plan - 01/09/16 1756    Clinical Impression Statement Mild discomfort with steps/  hip hinge (twinge) brief.  otherwise no pain.  She in now going up and down steps step over step for the first time in quite awhile.  She is really motivated. LTG # 2, #3 partially met.   PT Next Visit Plan continiue quad strengthening.    PT Home Exercise Plan continue   Consulted and Agree with Plan of Care --      Patient will benefit from skilled therapeutic intervention in order to improve the following deficits and impairments:  Hypomobility, Decreased range of motion, Impaired flexibility, Decreased strength, Decreased mobility, Abnormal gait  Visit Diagnosis: Difficulty in walking, not elsewhere classified  Muscle weakness (generalized)  Stiffness of left knee, not elsewhere classified  Stiffness of right knee, not elsewhere classified     Problem List Patient Active Problem List   Diagnosis Date Noted  . Arthralgia of both knees 07/03/2014  . Achilles bursitis or tendinitis  12/30/2012  . Plantar fascia syndrome 11/18/2012  . Cervical spondylosis 09/21/2012  . Cervical spondylolysis 09/21/2012  . 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 BACK PAIN 04/15/2010  . Osteoarthrosis, unspecified whether generalized or localized, lower leg 05/01/2009  . KNEE PAIN 02/26/2009  . [place] DEGENERATION 03/27/2008  . HIP PAIN 02/23/2008  . BURSITIS, RIGHT HIP 02/23/2008    [facility] 01/09/2016, 6:02 PM  [facility] [address] [place], [place], [address] Phone: [phone]   Fax:  [phone]  Name: DEMONICA FARREY MRN: [id number] Date of Birth: 05/21/1946    Melvenia Needles, PTA 01/09/2016 6:02 PM Phone: [phone] Fax: [phone]

## 2016-01-15 ENCOUNTER — Encounter: Payer: Medicare Other | Admitting: Physical Therapy

## 2016-01-17 ENCOUNTER — Ambulatory Visit: Payer: Medicare Other | Attending: Family Medicine | Admitting: Physical Therapy

## 2016-01-17 ENCOUNTER — Ambulatory Visit (INDEPENDENT_AMBULATORY_CARE_PROVIDER_SITE_OTHER): Payer: Medicare Other | Admitting: Orthopedic Surgery

## 2016-01-17 VITALS — BP 117/69 | HR 71 | Ht 65.0 in | Wt 190.0 lb

## 2016-01-17 DIAGNOSIS — M545 Low back pain, unspecified: Secondary | ICD-10-CM

## 2016-01-17 DIAGNOSIS — M25662 Stiffness of left knee, not elsewhere classified: Secondary | ICD-10-CM

## 2016-01-17 DIAGNOSIS — M6281 Muscle weakness (generalized): Secondary | ICD-10-CM | POA: Diagnosis present

## 2016-01-17 DIAGNOSIS — R262 Difficulty in walking, not elsewhere classified: Secondary | ICD-10-CM

## 2016-01-17 DIAGNOSIS — M25661 Stiffness of right knee, not elsewhere classified: Secondary | ICD-10-CM | POA: Insufficient documentation

## 2016-01-17 DIAGNOSIS — M25561 Pain in right knee: Secondary | ICD-10-CM | POA: Diagnosis not present

## 2016-01-17 DIAGNOSIS — M25562 Pain in left knee: Secondary | ICD-10-CM | POA: Diagnosis not present

## 2016-01-17 NOTE — Patient Instructions (Signed)
Keep up her exercises.

## 2016-01-17 NOTE — Therapy (Addendum)
[place], [place], [address] Phone: [phone]   Fax:  [phone]  Physical Therapy Treatment  Patient Details  Name: Nancy George MRN: [id number] Date of Birth: February 10, 1946 Referring Provider: Dr. Dyane Dustman  Encounter Date: 01/17/2016      PT End of Session - 01/17/16 1054    Visit Number 5   Number of Visits 8   Date for PT Re-Evaluation 01/25/16   PT Start Time 0730   PT Stop Time 0805   PT Time Calculation (min) 35 min   Activity Tolerance Patient tolerated treatment well;No increased pain   Behavior During Therapy [facility] for tasks assessed/performed      Past Medical History  Diagnosis Date  . HTN (hypertension)   . 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 Arthritis     Past Surgical History  Procedure Laterality Date  . Appendectomy    . Tubal ligation    . Tonsillectomy    . Bartholin cyst marsupialization      There were no vitals filed for this visit.      Subjective Assessment - 01/17/16 0734    Subjective No pain.  Able to go shopping without pain.   Currently in Pain? No/denies   Pain Score 0-No pain   Pain Location Knee   Pain Orientation Left;Right   Pain Relieving Factors exercises.                         [facility] PT Treatment/Exercise - 01/17/16 0001    Self-Care   Other Self-Care Comments  exercise stratigies, how to continue.  Keep up the good work,  Architect and encouragement.    Knee/Hip Exercises: Aerobic   Nustep 5 minutes, L5   Knee/Hip Exercises: Standing   Wall Squat 10 reps  painfree   Knee/Hip Exercises: Seated   Sit to Sand --  1 x no pain   Knee/Hip Exercises: Supine   Bridges Limitations 10  needed to hold her feet   Knee/Hip Exercises: Sidelying   Hip ABduction Strengthening   Hip ABduction Limitations 10 X each side   Clams 10 X each side                     PT Long Term Goals - 01/17/16 1059    PT  LONG TERM GOAL #1   Title Ptwill be I with HEP for core, hip and knee strength   Baseline independent   Time 4   Period Weeks   Status Achieved   PT LONG TERM GOAL #2   Title Pt will be able to report only min difficulty, pain <2/10 when transitioning from sit to stand.    Baseline no pain with sit to stand as long as she scoots to the edge first   Time 4   Period Weeks   Status Achieved   PT LONG TERM GOAL #3   Title Pt will do stairs with less pain (25% less overall) reciprocally with 1 UE rail.    Baseline at least 50% less apin on stairs   Time 4   Period Weeks   Status Achieved   PT LONG TERM GOAL #4   Title Pt will score  FOTO less than 37% limited to demo improved function.    Baseline 41 % limited  ( Scored better than FOTO predicted   Time 4   Period Weeks   Status Partially Met  Plan - 01/17/16 1055    Clinical Impression Statement Today is her last day.  She is independent with her home exercises and she has membership at [employer].   She is painfree most of the time, all goals met except for FOTO score 41% limitation which is better than predicted. Goal was 31 % limited.   PT Next Visit Plan Discharge to home program at patient's request.   PT Home Exercise Plan continue   Consulted and Agree with Plan of Care Patient      Patient will benefit from skilled therapeutic intervention in order to improve the following deficits and impairments:  Hypomobility, Decreased range of motion, Impaired flexibility, Decreased strength, Decreased mobility, Abnormal gait  Visit Diagnosis: Difficulty in walking, not elsewhere classified  Muscle weakness (generalized)  Stiffness of left knee, not elsewhere classified  Stiffness of right knee, not elsewhere classified     Problem List Patient Active Problem List   Diagnosis Date Noted  . Arthralgia of both knees 07/03/2014  . Achilles bursitis or tendinitis 12/30/2012  . Plantar fascia syndrome  11/18/2012  . Cervical spondylosis 09/21/2012  . Cervical spondylolysis 09/21/2012  . 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 BACK PAIN 04/15/2010  . Osteoarthrosis, unspecified whether generalized or localized, lower leg 05/01/2009  . KNEE PAIN 02/26/2009  . 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 HIP PAIN 02/23/2008  . BURSITIS, RIGHT HIP 02/23/2008    HARRIS,KAREN 01/17/2016, 1:23 PM  [facility] [address] [place], [place], [address] Phone: [phone]   Fax:  [phone]  Name: Nancy George MRN: [id number] Date of Birth: 1946/03/19    PHYSICAL THERAPY DISCHARGE SUMMARY  Visits from Start of Care: 5  Current functional level related to goals / functional outcomes: See above   Remaining deficits: See above   Education / Equipment: HEP Plan: Patient agrees to discharge.  Patient goals were met. Patient is being discharged due to being pleased with the current functional level.  ?????   Darrel Hoover, PT                 01/21/16                  9:03 AM

## 2016-01-17 NOTE — Progress Notes (Signed)
Chief Complaint  Patient presents with  . Follow-up    Bilateral knees    And lower back pain  Patient has been to therapy and things look very good  She is doing well   She has no pain and has made significant improvement   She can follow as needed

## 2017-01-27 ENCOUNTER — Telehealth: Payer: Self-pay | Admitting: Nutrition

## 2017-01-27 NOTE — Telephone Encounter (Signed)
TC to pt. She notes she has a dietitian and doesn't need to schedule appt.

## 2017-11-30 ENCOUNTER — Ambulatory Visit: Payer: Medicare Other | Admitting: Orthopedic Surgery

## 2017-11-30 ENCOUNTER — Encounter: Payer: Self-pay | Admitting: Orthopedic Surgery

## 2017-11-30 DIAGNOSIS — M25562 Pain in left knee: Secondary | ICD-10-CM | POA: Diagnosis not present

## 2017-11-30 DIAGNOSIS — M25561 Pain in right knee: Secondary | ICD-10-CM

## 2017-11-30 DIAGNOSIS — G8929 Other chronic pain: Secondary | ICD-10-CM

## 2017-11-30 NOTE — Progress Notes (Signed)
Progress Note   Patient ID: Nancy George, female   DOB: 06/04/1946, 72 y.o.   MRN: [id number]  Chief Complaint  Patient presents with  . Knee Problem    I want my knees checked    HPI [age] year old female with chronic history of intermittent knee problems no pain at present just wants her knees checked concerned about possibility of developing more arthritis  ROS No outpatient medications have been marked as taking for the 11/30/17 encounter (Office Visit) with Vickki HearingHarrison, Stanley E, MD.    Allergies  Allergen Reactions  . Penicillins     Causes Yeast infection     There were no vitals taken for this visit.  Physical Exam  Both knees have normal alignment full range of motion no tenderness swelling or instability   Medical decision-making Encounter Diagnosis  Name Primary?  . Chronic pain of both knees Yes    We discussed flex agenic's platelets stem cells hyaluronic acid.  She is not having any pain and does not need any of those agents at this time follow-up as needed  No orders of the defined types were placed in this encounter.    Fuller CanadaStanley Harrison, MD 11/30/2017 2:31 PM

## 2017-12-19 IMAGING — MR MR KNEE*L* W/O CM
4 of 5 series · 15 of 40 positions shown · non-contrast
Comparison: None.

CLINICAL DATA: Bilateral knee pain, left worse than right.

EXAM:
MRI OF THE LEFT KNEE WITHOUT CONTRAST
TECHNIQUE: Multiplanar, multisequence MR imaging of the knee was performed. No
intravenous contrast was administered.

[Series 4: pd_tse_fs_cor · coronal · 3.2mm · 0.25mm/px · 3 of 26 slices shown]
[im 4/26]
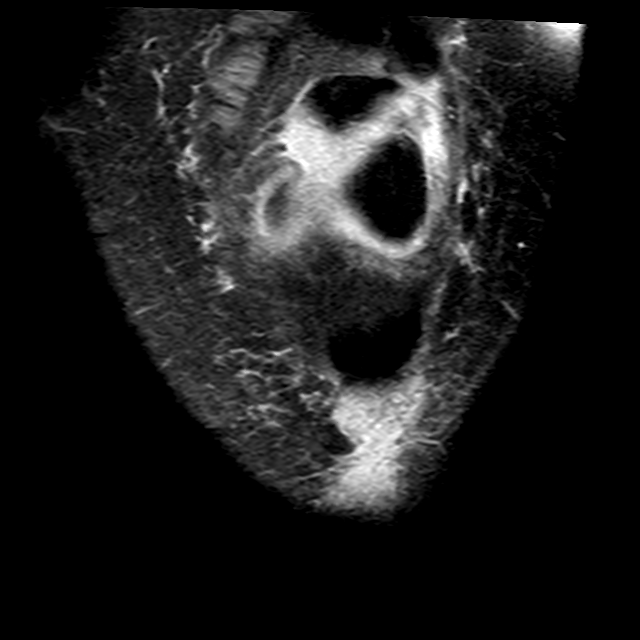
[im 13/26]
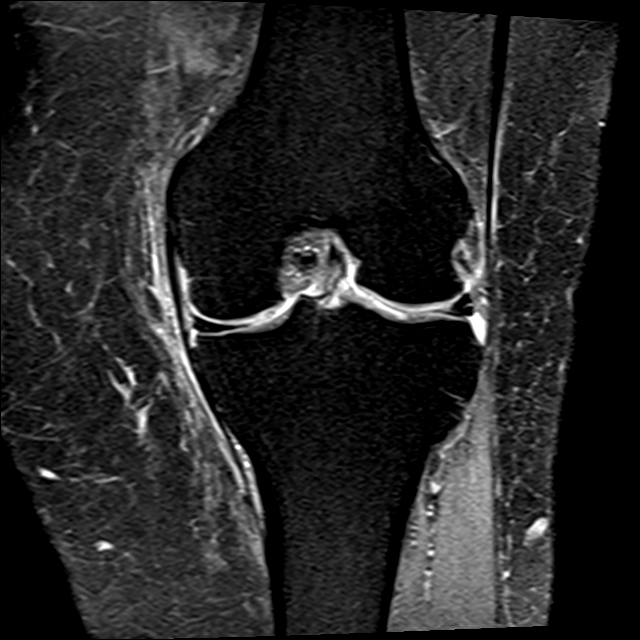
[im 22/26]
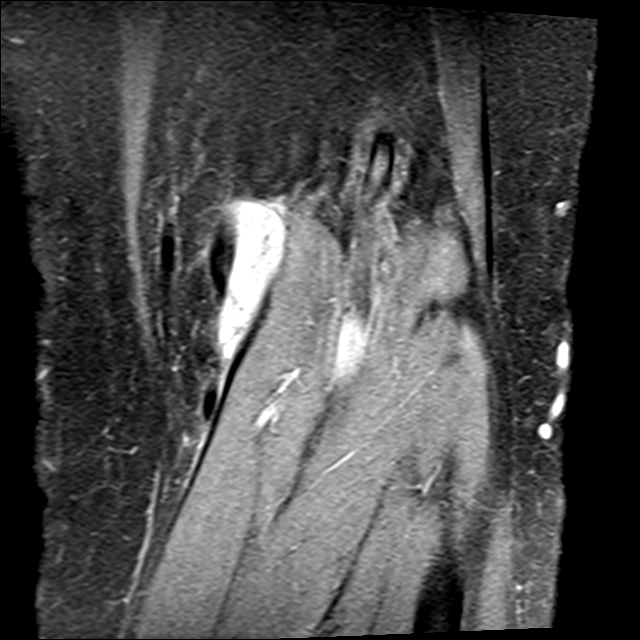

[Series 5: T2 fat-sat · coronal · 3.2mm · 0.62mm/px · 3 of 26 slices shown]
[im 4/26]
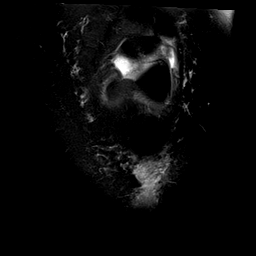
[im 13/26]
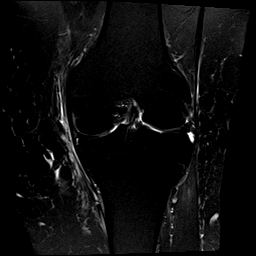
[im 22/26]
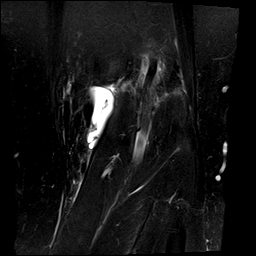

[Series 6: PD fat-sat · sagittal · 3.2mm · 0.25mm/px · 6 of 23 slices shown]
[im 1/23]
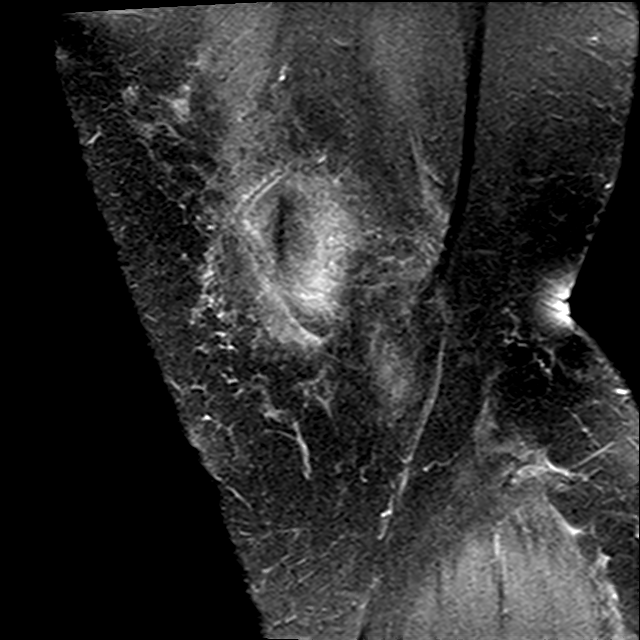
[im 4/23]
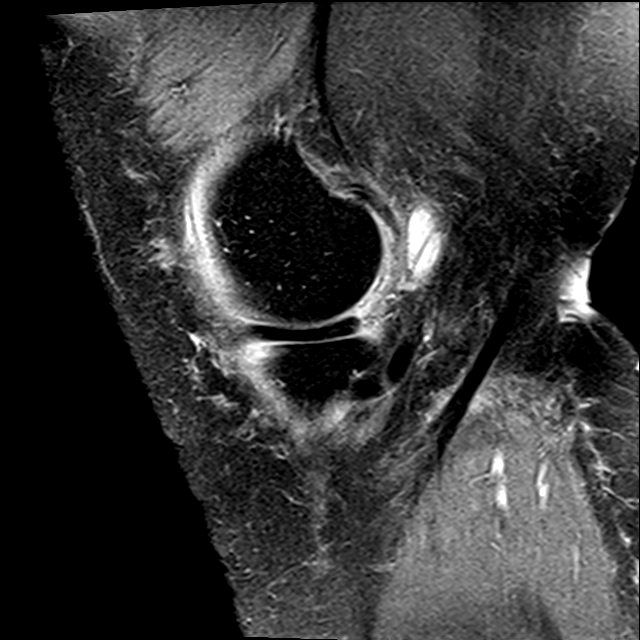
[im 8/23]
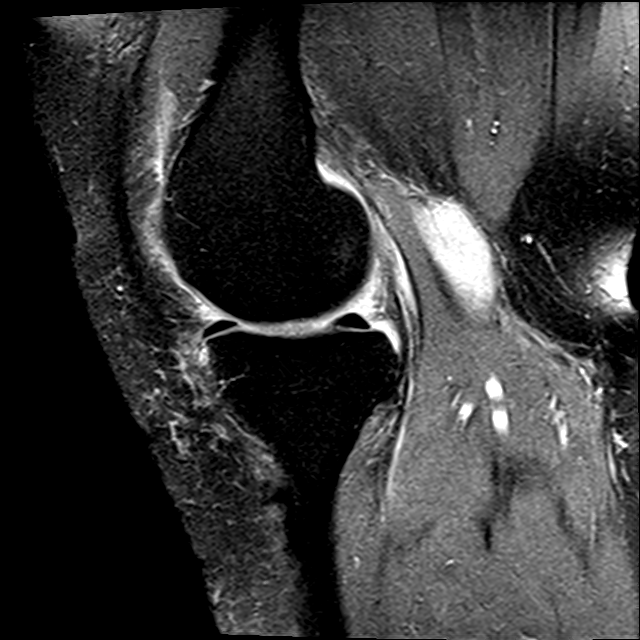
[im 12/23]
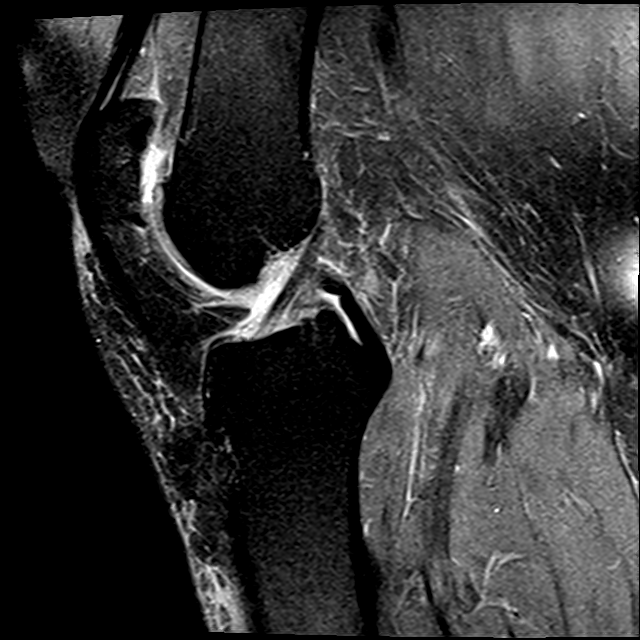
[im 15/23]
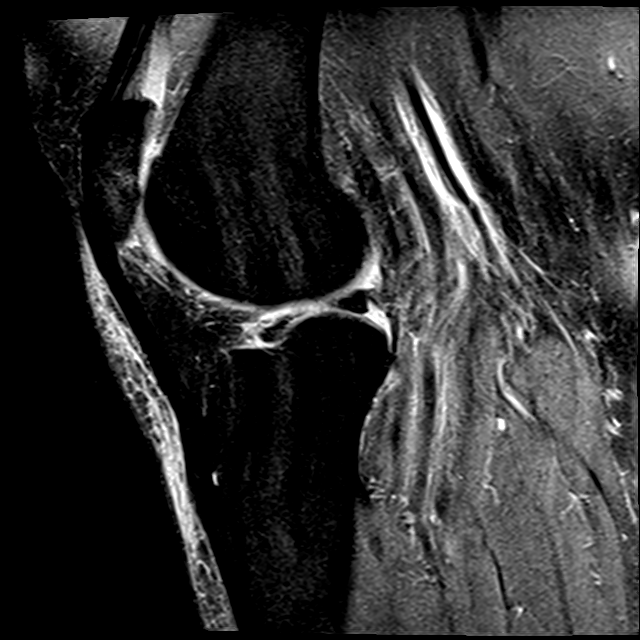
[im 19/23]
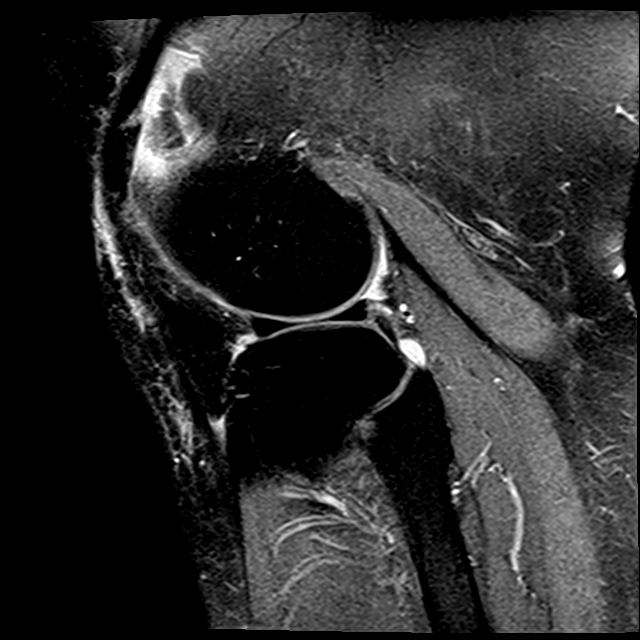

[Series 7: T1 · coronal · 3.2mm · 0.25mm/px · 3 of 26 slices shown]
[im 4/26]
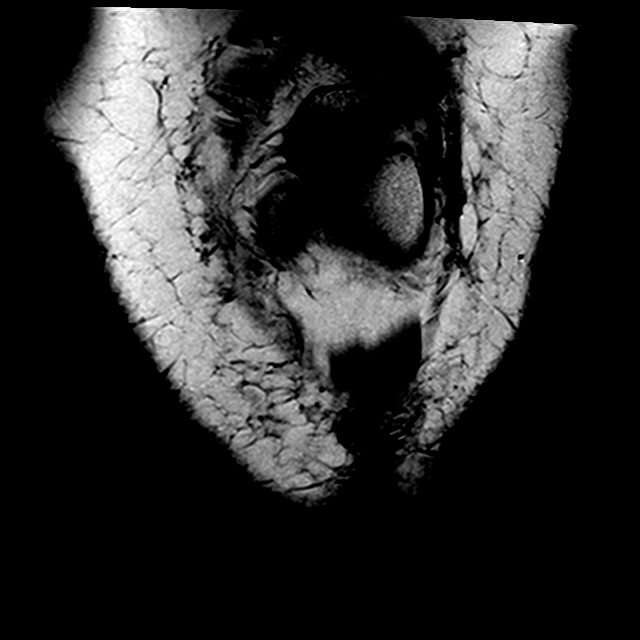
[im 15/26]
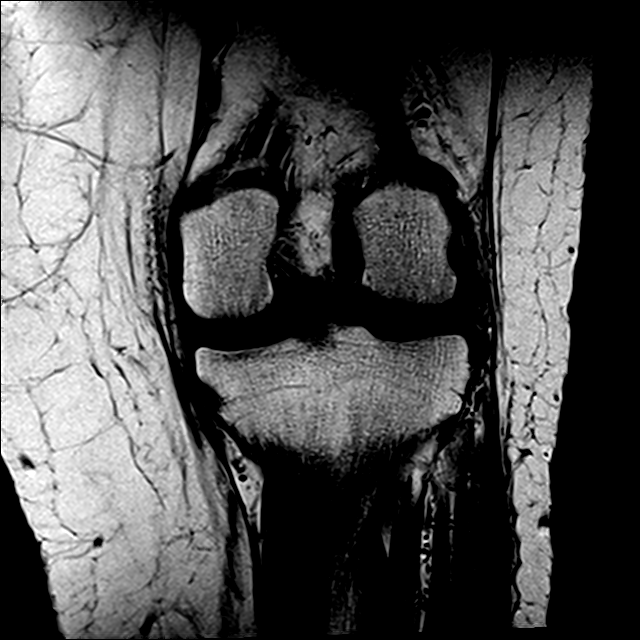
[im 22/26]
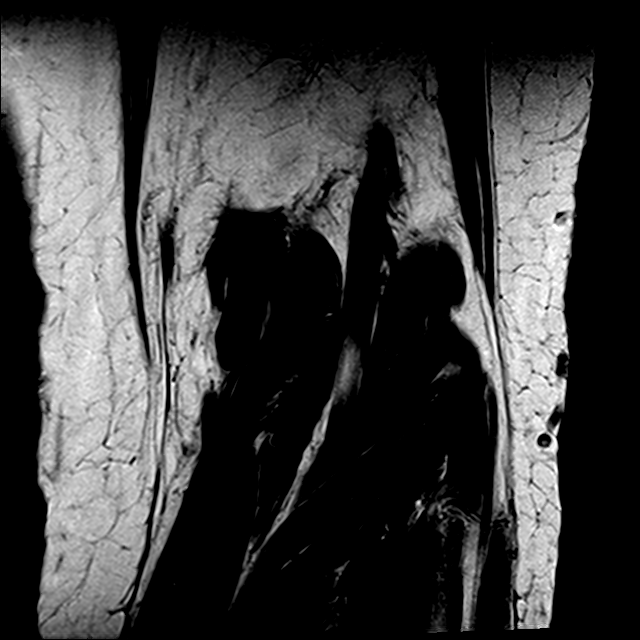

[15 of 40 positions shown; findings below may reference images not displayed]

FINDINGS: MENISCI

Medial meniscus: Mild increased signal in the posterior horn of the
medial meniscus likely reflecting mild degeneration. No discrete
tear.

Lateral meniscus: Small tear along the superior surface of the body
of the lateral meniscus.

LIGAMENTS

Cruciates:  Intact ACL and PCL.

Collaterals: Mild soft tissue edema superficial to the MCL which may
reflect mild MCL strain. MCL is otherwise intact. Lateral collateral
ligament complex is intact.

CARTILAGE

Patellofemoral: High-grade partial-thickness cartilage loss with
areas of full-thickness cartilage loss involving the medial and
lateral patellar facets and subchondral reactive marrow changes.
Partial thickness cartilage loss of the trochlea.

Medial: Partial thickness cartilage loss of the medial femoral
condyle and medial tibial plateau.

Lateral: Mild partial-thickness cartilage loss of the medial aspect
of the lateral femoral condyle and lateral tibial plateau.

Joint: Small joint effusion. No plical thickening. Normal Hoffa's
fat.

Popliteal Fossa:  Small Baker cyst.  Intact popliteus tendon.

Extensor Mechanism:  Intact.

Bones: No other marrow signal abnormality. No fracture or
dislocation.
IMPRESSION: 1. Tricompartmental cartilage abnormalities most severe in the
patellofemoral compartment as described above.
2. Mild soft tissue edema superficial to the MCL which may reflect
mild MCL strain. MCL is otherwise intact.
3. Small tear along the superior surface of the body of the lateral
meniscus.

## 2018-05-14 DIAGNOSIS — R739 Hyperglycemia, unspecified: Secondary | ICD-10-CM | POA: Diagnosis not present

## 2018-05-14 DIAGNOSIS — E7800 Pure hypercholesterolemia, unspecified: Secondary | ICD-10-CM | POA: Diagnosis not present

## 2018-05-14 DIAGNOSIS — Z79899 Other long term (current) drug therapy: Secondary | ICD-10-CM | POA: Diagnosis not present

## 2018-05-14 DIAGNOSIS — I10 Essential (primary) hypertension: Secondary | ICD-10-CM | POA: Diagnosis not present

## 2018-05-14 DIAGNOSIS — Z23 Encounter for immunization: Secondary | ICD-10-CM | POA: Diagnosis not present

## 2018-05-14 DIAGNOSIS — R002 Palpitations: Secondary | ICD-10-CM | POA: Diagnosis not present

## 2018-10-29 ENCOUNTER — Ambulatory Visit (INDEPENDENT_AMBULATORY_CARE_PROVIDER_SITE_OTHER): Payer: Medicare Other

## 2018-10-29 ENCOUNTER — Encounter: Payer: Self-pay | Admitting: Orthopedic Surgery

## 2018-10-29 ENCOUNTER — Other Ambulatory Visit: Payer: Self-pay

## 2018-10-29 ENCOUNTER — Ambulatory Visit: Payer: Medicare Other | Admitting: Orthopedic Surgery

## 2018-10-29 VITALS — BP 132/73 | HR 54 | Ht 65.0 in | Wt 184.0 lb

## 2018-10-29 DIAGNOSIS — G8929 Other chronic pain: Secondary | ICD-10-CM

## 2018-10-29 DIAGNOSIS — M76821 Posterior tibial tendinitis, right leg: Secondary | ICD-10-CM | POA: Diagnosis not present

## 2018-10-29 DIAGNOSIS — M25571 Pain in right ankle and joints of right foot: Secondary | ICD-10-CM

## 2018-10-29 MED ORDER — DICLOFENAC SODIUM 1 % TD GEL: 2.0000 g | Freq: Four times a day (QID) | TRANSDERMAL | 5 refills | Status: DC

## 2018-10-29 NOTE — Progress Notes (Signed)
ESTABLISHED PATIENT NEW PROBLEM OFFICE VISIT  Chief Complaint  Patient presents with  . Ankle Pain    Right ankle pain x4 months    73 year old female presents for evaluation of her right ankle.  She started having pain about 4 or 5 months ago.  She denies any trauma she has a history of plantar fasciitis  She complains of pain on the bottom of her foot in her arch which radiates up the back of the medial malleolus seems to be constant but worse with more activity and weightbearing unrelieved by turmeric no other associated symptoms other than some dull pain on the anterolateral side of the lower leg.  Quality of pain dull severity 5-6 out of 10   Review of Systems  Musculoskeletal:       The patient denies any significant pain in her knee joints although they have been uncomfortable for some time  Neurological: Negative for tingling and focal weakness.     Past Medical History:  Diagnosis Date  . Arthritis   . HTN (hypertension)   . 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 APPENDECTOMY    . BARTHOLIN CYST MARSUPIALIZATION    . TONSILLECTOMY    . TUBAL LIGATION      Family History  Problem Relation Age of Onset  . Arthritis Unknown   . Colon cancer Unknown 53       half sister   Social History   Tobacco Use  . Smoking status: Never Smoker  . Smokeless tobacco: Never Used  Substance Use Topics  . Alcohol use: No  . Drug use: No    Allergies  Allergen Reactions  . Penicillins     Causes Yeast infection    Current Meds  Medication Sig  . losartan (COZAAR) 100 MG tablet Take 100 mg by mouth daily.  . Red Yeast Rice 600 MG CAPS Take 1,200 mg by mouth daily.    BP 132/73   Pulse (!) 54   Ht 5\' 5"  (1.651 m)   Wt 184 lb (83.5 kg)   BMI 30.62 kg/m   Physical Exam Constitutional:      Appearance: Normal appearance. She is normal weight. She is not ill-appearing.  Skin:    General: Skin is warm and dry.     Capillary Refill: Capillary refill takes less than 2 seconds.  Neurological:     Mental Status: She is alert.  Psychiatric:        Mood and Affect: Mood normal.        Behavior: Behavior normal.        Thought Content: Thought content normal.        Judgment: Judgment normal.     Ortho Exam Right foot/ankle Patient does not have a noticeable limp but she does have a fairly significant pes planus with tenderness over the posterior tibial tendon mild swelling.  Ankle range of motion is normal.  Strength in the ankle is normal.  No foot atrophy is noted.  Ankle is stable.  Posterior tibial tendon weakness is noted.  Skin is normal pulse and perfusion are normal  Left foot/ankle Normal alignment range of motion no instability normal muscle tone  MEDICAL DECISION SECTION  X-rays were done today in the office 3 views of the right ankle please see the dictated report for details  There was no acute fracture dislocation and there was no bone lesion the bone quality was excellent there were no findings  to suggest arthritis  Encounter Diagnoses  Name Primary?  . Chronic pain of right ankle   . Posterior tibial tendon dysfunction (PTTD) of right lower extremity Yes    PLAN:  Recommend orthotics and medication as listed below follow-up in 6 weeks  Meds ordered this encounter  Medications  . diclofenac sodium (VOLTAREN) 1 % GEL    Sig: Apply 2 g topically 4 (four) times daily.    Dispense:  5 Tube    Refill:  5    Fuller Canada, MD  10/29/2018 12:30 PM

## 2018-10-29 NOTE — Patient Instructions (Addendum)
Wear orthotics in closed shoes   Start medication pick up from pharmacy    Flat Feet, Adult  Normally, a foot has a curve, called an arch, on its inner side. The arch creates a gap between the foot and the ground. Flat feet is a common condition in which one or both feet do not have an arch. What are the causes? This condition may be caused by:  Failure of a normal arch to develop during childhood.  An injury to tendons and ligaments in the foot, such as to the tendon that supports the arch (posterior tibial tendon).  Loose tendons or ligaments in the foot.  A wearing down of the arch over time.  Injury to bones in the foot.  An abnormality in the bones of the foot, called tarsal coalition. This happens when two or more bones in the foot are joined together (fused) before birth. What increases the risk? This condition is more likely to develop in:  Females.  Adults age 74 or older.  People who: ? Have a family history of flat feet. ? Have a history of childhood flexible flatfoot. ? Are obese. ? Have diabetes. ? Have high blood pressure. ? Participate in high-impact sports. ? Have inflammatory arthritis. ? Have a history of broken (fractured) or dislocated bones in the foot. What are the signs or symptoms? Symptoms of this condition include:  Pain or tightness along the bottom of the foot.  Foot pain that gets worse with activity.  Swelling of the inner side of the foot.  Swelling of the ankle.  Pain on the outer side of the ankle.  Changes in the way that you walk (gait).  Pronation. This is when the foot and ankle lean inward when you are standing.  Bony bumps on the top or inner side of the foot. How is this diagnosed? This condition is diagnosed with a physical exam of your foot and ankle. Your health care provider may also:  Look at your shoes for patterns of wear on the soles.  Order imaging tests, such as X-rays, a CT scan, or an MRI.  Refer you to  a health care provider who specializes in feet (podiatrist) or a physical therapist. How is this treated? This condition may be treated with:  Stretching exercises or physical therapy. This helps to increase range of motion and relieve pain.  A shoe insert (orthotic). This helps to support the arch of your foot. Orthotics can be purchased from a store or can be custom-made by your health care provider.  Wearing shoes with appropriate arch support. This is especially important for athletes.  Medicines. These may be prescribed to relieve pain.  An ankle brace, boot, or cast. These may be used to relieve pressure on your foot. You may be given crutches if walking is painful.  Surgery. This may be done to improve the alignment of your foot. This is only needed if your posterior tibial tendon is torn or if you have tarsal coalition. Follow these instructions at home: Activity  Do any exercises as told by your health care provider.  If an activity causes pain, avoid it or try to find another activity that does not cause pain. General instructions  Wear orthotics and appropriate shoes as told by your health care provider.  Take over-the-counter and prescription medicines only as told by your health care provider.  Wear an ankle brace, boot, or cast as told by your health care provider.  Use crutches as told  by your health care provider.  Keep all follow-up visits as told by your health care provider. This is important. How is this prevented? To prevent the condition from getting worse:  Wear comfortable, supportive shoes that are appropriate for your activities.  Maintain a healthy weight.  Stay active in a way that your health care provider recommends. This will help to keep your feet flexible and strong.  Manage long-term (chronic) health conditions, such as diabetes, high blood pressure, and inflammatory arthritis.  Work with a health care provider if you have concerns about  your feet or shoes. Contact a health care provider if:  You have pain in your foot or lower leg that gets worse or does not improve with medicine.  You have pain or difficulty when walking.  You have problems with your orthotics. Summary  Flat feet is a common condition in which one or both feet do not have a curve, called an arch, on the inner side.  Your health care provider may recommend a shoe insert (orthotic) or shoes with the appropriate arch support.  Other treatments may include stretching exercises or physical therapy, medicines to relieve pain, and wearing an ankle brace, boot, or cast.  Surgery may be done if you have a tear in the tendon that supports your arch (posterior tibial tendon) or if two or more of your foot bones were joined together (fused)  before birth (tarsal coalition). This information is not intended to replace advice given to you by your health care provider. Make sure you discuss any questions you have with your health care provider. Document Released: 06/01/2009 Document Revised: 10/15/2016 Document Reviewed: 10/15/2016 Elsevier Interactive Patient Education  2019 [employer].

## 2018-12-10 ENCOUNTER — Ambulatory Visit: Payer: Medicare Other | Admitting: Orthopedic Surgery

## 2019-01-21 ENCOUNTER — Ambulatory Visit: Payer: Self-pay | Admitting: Orthopedic Surgery

## 2019-07-21 DIAGNOSIS — N951 Menopausal and female climacteric states: Secondary | ICD-10-CM | POA: Insufficient documentation

## 2019-09-15 DIAGNOSIS — I10 Essential (primary) hypertension: Secondary | ICD-10-CM | POA: Insufficient documentation

## 2019-09-21 DIAGNOSIS — E785 Hyperlipidemia, unspecified: Secondary | ICD-10-CM | POA: Insufficient documentation

## 2019-09-21 DIAGNOSIS — E041 Nontoxic single thyroid nodule: Secondary | ICD-10-CM | POA: Insufficient documentation

## 2019-09-21 DIAGNOSIS — E042 Nontoxic multinodular goiter: Secondary | ICD-10-CM | POA: Insufficient documentation

## 2020-11-05 ENCOUNTER — Ambulatory Visit: Payer: Medicare Other | Admitting: Orthopedic Surgery

## 2020-11-05 ENCOUNTER — Ambulatory Visit: Payer: Medicare Other

## 2020-11-05 ENCOUNTER — Telehealth: Payer: Self-pay | Admitting: Orthopedic Surgery

## 2020-11-05 ENCOUNTER — Other Ambulatory Visit: Payer: Self-pay

## 2020-11-05 ENCOUNTER — Encounter: Payer: Self-pay | Admitting: Orthopedic Surgery

## 2020-11-05 VITALS — BP 181/85 | HR 55 | Ht 65.0 in | Wt 193.4 lb

## 2020-11-05 DIAGNOSIS — M25511 Pain in right shoulder: Secondary | ICD-10-CM

## 2020-11-05 DIAGNOSIS — M7541 Impingement syndrome of right shoulder: Secondary | ICD-10-CM | POA: Diagnosis not present

## 2020-11-05 NOTE — Telephone Encounter (Signed)
Patient called back and states she did not receive a written note for a keyboard and mouse for work.    Please call her back at [phone]

## 2020-11-05 NOTE — Progress Notes (Signed)
Chief Complaint  Patient presents with  . Shoulder Pain    R/ having no pain now, but when reaching over head or behind back it really hurts.   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 Arthritis   . HTN (hypertension)   . 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    Appearance: She is well-developed.     Comments: Well developed, well nourished Normal grooming and hygiene     Cardiovascular:     Comments: No peripheral edema Musculoskeletal:     Comments: Right shoulder excellent strength no tenderness pain at 120 degrees of flexion positive impingement sign no rotator cuff weakness  Skin:    General: Skin is warm and dry.     Capillary Refill: Capillary refill takes less than 2 seconds.  Neurological:     General: No focal deficit present.     Mental Status: She is alert and oriented to person, place, and time.     Sensory: No sensory deficit.     Coordination: Coordination normal.     Gait: Gait normal.     Deep Tendon Reflexes: Reflexes are normal and symmetric.  Psychiatric:        Mood and Affect: Mood normal.        Behavior: Behavior normal.        Thought Content: Thought content normal.        Judgment: Judgment normal.     Comments: Affect normal     X-ray done in the office was normal  Encounter Diagnoses  Name Primary?  . Acute pain of right shoulder   . Impingement syndrome of right shoulder Yes     Recommend stretching strengthening program adjustment for ergonomic keyboard  Follow-up if not improved

## 2020-11-05 NOTE — Telephone Encounter (Signed)
Called patient printed order, mailed to patient per her request

## 2020-11-05 NOTE — Patient Instructions (Signed)

## 2020-11-07 DIAGNOSIS — F4320 Adjustment disorder, unspecified: Secondary | ICD-10-CM | POA: Insufficient documentation

## 2021-02-01 DIAGNOSIS — M85852 Other specified disorders of bone density and structure, left thigh: Secondary | ICD-10-CM | POA: Insufficient documentation

## 2021-02-01 DIAGNOSIS — M85851 Other specified disorders of bone density and structure, right thigh: Secondary | ICD-10-CM | POA: Insufficient documentation

## 2021-08-26 ENCOUNTER — Ambulatory Visit: Payer: Medicare Other | Admitting: Orthopedic Surgery

## 2021-08-26 ENCOUNTER — Other Ambulatory Visit: Payer: Self-pay

## 2021-08-26 DIAGNOSIS — F419 Anxiety disorder, unspecified: Secondary | ICD-10-CM | POA: Insufficient documentation

## 2021-08-26 DIAGNOSIS — N3281 Overactive bladder: Secondary | ICD-10-CM | POA: Insufficient documentation

## 2021-08-26 DIAGNOSIS — M545 Low back pain, unspecified: Secondary | ICD-10-CM

## 2021-08-26 DIAGNOSIS — M5410 Radiculopathy, site unspecified: Secondary | ICD-10-CM | POA: Diagnosis not present

## 2021-08-26 DIAGNOSIS — F329 Major depressive disorder, single episode, unspecified: Secondary | ICD-10-CM | POA: Insufficient documentation

## 2021-08-26 NOTE — Progress Notes (Signed)
Chief Complaint  Patient presents with   Back Pain    Lower right back and shoots down right leg    76 year old female called Nancy George in December near Christmas time with acute onset of atraumatic lower back pain which eventually started to radiate down her right leg however the symptoms have resolved  She denies any trauma  She does go to the chiropractor every 2 weeks for "adjustments" but this does not really involve adjusting her back as for other muscle aches  Review of Systems  Constitutional:  Negative for fever and weight loss.  Cardiovascular:  Negative for chest pain.  Gastrointestinal:  Negative for constipation and diarrhea.  Genitourinary:  Negative for frequency and urgency.    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     Comments: Lumbar spine mild tenderness right side lower back no midline or left-sided tenderness  Flexion extension rotation pain-free  Skin:    General: Skin is warm and dry.     Capillary Refill: Capillary refill takes less than 2 seconds.  Neurological:     General: No focal deficit present.     Mental Status: She is disoriented.     Sensory: No sensory deficit.     Motor: No weakness.     Coordination: Coordination normal.     Gait: Gait normal.     Deep Tendon Reflexes: Reflexes normal.  Psychiatric:        Mood and Affect: Mood normal.        Behavior: Behavior normal.        Thought Content: Thought content normal.        Judgment: Judgment normal.

## 2021-08-26 NOTE — Patient Instructions (Signed)
For 6 weeks avoid heavy lifting and any bending and lifting If it pain returns let us know  Hopefully your pain will not recur

## 2021-10-11 ENCOUNTER — Encounter: Payer: Self-pay | Admitting: Orthopedic Surgery

## 2022-02-10 ENCOUNTER — Ambulatory Visit: Payer: Medicare Other | Admitting: Orthopedic Surgery

## 2022-02-10 ENCOUNTER — Ambulatory Visit (INDEPENDENT_AMBULATORY_CARE_PROVIDER_SITE_OTHER): Payer: Medicare Other

## 2022-02-10 DIAGNOSIS — M25561 Pain in right knee: Secondary | ICD-10-CM | POA: Diagnosis not present

## 2022-02-19 ENCOUNTER — Encounter: Payer: Self-pay | Admitting: Orthopedic Surgery

## 2022-02-19 DIAGNOSIS — M25561 Pain in right knee: Secondary | ICD-10-CM | POA: Diagnosis not present

## 2022-02-19 MED ORDER — LIDOCAINE HCL (PF) 1 % IJ SOLN
5.0000 mL | INTRAMUSCULAR | Status: AC | PRN
Start: 2022-02-19 — End: 2022-02-19
  Administered 2022-02-19: 5 mL

## 2022-02-19 MED ORDER — METHYLPREDNISOLONE ACETATE 40 MG/ML IJ SUSP
40.0000 mg | INTRAMUSCULAR | Status: AC | PRN
  Administered 2022-02-19: 40 mg via INTRA_ARTICULAR

## 2022-02-19 NOTE — Progress Notes (Signed)
Office Visit Note   Patient: Nancy George           Date of Birth: 1946/07/16           MRN: [id number] Visit Date: 02/10/2022              Requested by: Eartha Inch, MD [address] [place],  [place] [address] PCP: Eartha Inch, MD  Chief Complaint  Patient presents with   Right Knee - Pain      HPI: Patient is a 76 year old woman who was seen for acute onset of right knee pain patient states she cannot weight-bear.  Patient states that 10 years ago she was told she had bone-on-bone arthritis.  Patient states that Saturday she walked a lot and her knee gave out on her.  She is currently using a cane for ambulation.  Assessment & Plan: Visit Diagnoses:  1. Acute pain of right knee     Plan: Right knee was injected she tolerated this well follow-up if symptoms persist may need an MRI scan.  Follow-Up Instructions: Return if symptoms worsen or fail to improve.   Ortho Exam  Patient is alert, oriented, no adenopathy, well-dressed, normal affect, normal respiratory effort. Examination patient has pain and crepitation with range of motion of the patellofemoral joint the medial joint line is tender to palpation.  Collaterals and cruciates are stable no redness no cellulitis no effusion.  Imaging: No results found. No images are attached to the encounter.  Labs: No results found for: "HGBA1C", "ESRSEDRATE", "CRP", "LABURIC", "REPTSTATUS", "GRAMSTAIN", "CULT", "LABORGA"   No results found for: "ALBUMIN", "PREALBUMIN", "CBC"  No results found for: "MG" No results found for: "VD25OH"  No results found for: "PREALBUMIN"     No data to display           There is no height or weight on file to calculate BMI.  Orders:  Orders Placed This Encounter  Procedures   XR Knee 1-2 Views Right   No orders of the defined types were placed in this encounter.    Procedures: Large Joint Inj: R knee on 02/19/2022 11:02 AM Indications: pain and diagnostic  evaluation Details: 22 G 1.5 in needle, anteromedial approach  Arthrogram: No  Medications: 5 mL lidocaine (PF) 1 %; 40 mg methylPREDNISolone acetate 40 MG/ML Outcome: tolerated well, no immediate complications Procedure, treatment alternatives, risks and benefits explained, specific risks discussed. Consent was given by the patient. Immediately prior to procedure a time out was called to verify the correct patient, procedure, equipment, support staff and site/side marked as required. Patient was prepped and draped in the usual sterile fashion.      Clinical Data: No additional findings.  ROS:  All other systems negative, except as noted in the HPI. Review of Systems  Objective: Vital Signs: There were no vitals taken for this visit.  Specialty Comments:  No specialty comments available.  PMFS History: Patient Active Problem List   Diagnosis Date Noted   Overactive bladder 08/26/2021   Chronic anxiety 08/26/2021   Reactive depression (situational) 08/26/2021   Osteopenia of both hips 02/01/2021   Adult situational stress disorder 11/07/2020   Hyperlipidemia 09/21/2019   Multiple thyroid nodules 09/21/2019   Thyroid nodule 09/21/2019   Essential hypertension 09/15/2019   Menopausal syndrome 07/21/2019   Arthralgia of both knees 07/03/2014   Achilles bursitis or tendinitis 12/30/2012   Plantar fascia syndrome 11/18/2012   Cervical spondylosis 09/21/2012   Cervical spondylolysis 09/21/2012  Patella, 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   OSTEOARTHRITIS, 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   BACK PAIN 04/15/2010   Osteoarthrosis, unspecified whether generalized or localized, lower leg 05/01/2009   KNEE PAIN 02/26/2009   DISC DEGENERATION 03/27/2008   HIP PAIN 02/23/2008   BURSITIS, RIGHT HIP 02/23/2008   Past Medical History:  Diagnosis Date   Arthritis    HTN (hypertension)    Patella, 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

## 2022-03-19 DIAGNOSIS — R002 Palpitations: Secondary | ICD-10-CM | POA: Insufficient documentation

## 2022-03-19 DIAGNOSIS — R001 Bradycardia, unspecified: Secondary | ICD-10-CM | POA: Insufficient documentation

## 2022-04-17 ENCOUNTER — Encounter: Payer: Self-pay | Admitting: Orthopedic Surgery

## 2022-04-17 ENCOUNTER — Ambulatory Visit: Payer: Medicare Other | Admitting: Orthopedic Surgery

## 2022-04-17 VITALS — BP 156/89 | HR 61 | Ht 65.0 in | Wt 209.0 lb

## 2022-04-17 DIAGNOSIS — M1712 Unilateral primary osteoarthritis, left knee: Secondary | ICD-10-CM | POA: Diagnosis not present

## 2022-04-17 NOTE — Progress Notes (Addendum)
Chief Complaint  Patient presents with   Knee Pain    Bilat knee pain for years started in the right but now its the left. Pain is noticeable going up steps.   76 year old female with recurrent pain in the left knee but seen previously years ago on  Primary complaint is pain going up the steps not having any discomfort really any other time  She can walk without any support skin is intact she is tender over the lateral joint line she has crepitance on range of motion of the knee.  She has full range of motion no instability  Encounter Diagnosis  Name Primary?   Arthritis of left knee Yes   Procedure note left knee injection   verbal consent was obtained to inject left knee joint  Timeout was completed to confirm the site of injection  The medications used were depomedrol 40 mg and 1% lidocaine 3 cc Anesthesia was provided by ethyl chloride and the skin was prepped with alcohol.  After cleaning the skin with alcohol a 20-gauge needle was used to inject the left knee joint. There were no complications. A sterile bandage was applied.

## 2022-04-18 DIAGNOSIS — M1712 Unilateral primary osteoarthritis, left knee: Secondary | ICD-10-CM | POA: Diagnosis not present

## 2022-04-18 MED ORDER — METHYLPREDNISOLONE ACETATE 40 MG/ML IJ SUSP
40.0000 mg | Freq: Once | INTRAMUSCULAR | Status: AC
  Administered 2022-04-18: 40 mg via INTRA_ARTICULAR

## 2022-04-18 NOTE — Addendum Note (Signed)
Addended by: Baird Kay on: 04/18/2022 08:39 AM   Modules accepted: Orders

## 2022-05-23 DIAGNOSIS — N1831 Chronic kidney disease, stage 3a: Secondary | ICD-10-CM | POA: Insufficient documentation

## 2022-05-28 DIAGNOSIS — I471 Supraventricular tachycardia, unspecified: Secondary | ICD-10-CM | POA: Insufficient documentation

## 2022-07-15 DIAGNOSIS — I4719 Other supraventricular tachycardia: Secondary | ICD-10-CM | POA: Insufficient documentation

## 2022-07-31 ENCOUNTER — Telehealth: Payer: Self-pay | Admitting: Orthopedic Surgery

## 2022-07-31 NOTE — Telephone Encounter (Signed)
Patient called, she wants to speak to Dr. Alto Denver, she wants to know what Dr. Romeo Apple thinks about Soft Wave Degenerative Therapy.  Pt's # [phone]

## 2022-07-31 NOTE — Telephone Encounter (Signed)
Appointment made

## 2022-07-31 NOTE — Telephone Encounter (Signed)
Appt ? Virtual   I can research it

## 2022-07-31 NOTE — Telephone Encounter (Signed)
She wants to discuss Soft wave therapy with you ok to make an appointment to discuss? I have never heard of it, so I had to look it up / are you familiar ?  You saw her last end of August for knee pain

## 2022-08-06 ENCOUNTER — Ambulatory Visit: Payer: Medicare Other | Admitting: Orthopedic Surgery

## 2022-08-06 VITALS — BP 130/64 | HR 68

## 2022-08-06 DIAGNOSIS — M1712 Unilateral primary osteoarthritis, left knee: Secondary | ICD-10-CM

## 2022-08-06 NOTE — Progress Notes (Signed)
Chief Complaint  Patient presents with   Follow-up    Recheck on left knee    Ms. Nancy George and I had a nice conversation regarding the anterior knee pain she is experiencing on the left side.  She was at the chiropractor's office and saw some literature related to soft wave therapy  I have no experience with this but I did google some information regarding its use and indications.  I do not think there is any downside to getting the therapy although I have no experience with it and I let the patient know as much.  She is basically having anterior knee pain which is worse when she negotiates any type of stair climbing or gets out of a chair  We reviewed the traditional treatments of Tylenol, anti-inflammatory medication, exercises and topical medications.  Patient will review the literature that I gave her and make decisions regarding the soft wave therapy and discuss it with a chiropractor if necessary

## 2022-10-16 ENCOUNTER — Encounter: Payer: Self-pay | Admitting: Radiology

## 2022-10-30 ENCOUNTER — Encounter: Payer: Self-pay | Admitting: Gastroenterology

## 2023-11-09 DIAGNOSIS — I2510 Atherosclerotic heart disease of native coronary artery without angina pectoris: Secondary | ICD-10-CM | POA: Insufficient documentation

## 2024-06-02 ENCOUNTER — Encounter: Payer: Self-pay | Admitting: Orthopedic Surgery

## 2024-06-02 ENCOUNTER — Ambulatory Visit: Admitting: Orthopedic Surgery

## 2024-06-02 VITALS — BP 159/97 | HR 80 | Ht 65.0 in | Wt 200.0 lb

## 2024-06-02 DIAGNOSIS — M2170 Unequal limb length (acquired), unspecified site: Secondary | ICD-10-CM

## 2024-06-02 DIAGNOSIS — M5136 Other intervertebral disc degeneration, lumbar region with discogenic back pain only: Secondary | ICD-10-CM | POA: Diagnosis not present

## 2024-06-02 NOTE — Progress Notes (Signed)
 Chief Complaint  Patient presents with   Knee Pain    No pain / states soreness     Mrs. Whicker would like to discuss the soreness she is having in her thighs  She has soreness in both thighs whether she is exercising or not whether she is active or not  Review of systems she has some discomfort in her lower back she has a history of degenerative disc disease based on MRI done in 2009  She also has pelvic obliquity and possible leg length discrepancy  She has changed her cholesterol medications to 1 that seems to work well in terms of the cholesterol she did not notice any change in the soreness in her thighs This is not a generalized joint or muscle aches so I doubt it is related to cholesterol medication  Her exam shows pelvic obliquity corrected with 1/4 inch lift on the right  Normal hip motion  Lower back tenderness bilaterally  Assessment and plan  Encounter Diagnoses  Name Primary?   Degeneration of intervertebral disc of lumbar region with discogenic back pain Yes   Leg length discrepancy    Recommend physical therapy, unclear really what is going on.  I think this could be proximal thigh related referred discomfort from degenerative disc disease   MRI dated 08/07/2008 lower thoracic and upper lumbar levels at L1-L2 are normal.    L2-L3:  Minimal left anterior lateral right disc protrusion and  associated osteophyte.  No stenosis.    L3-L4:  Mild disc desiccation.  Shallow left foraminal protrusion  without stenosis. Small synovial cysts project off the posterior  aspect of inferior L3-L4 facets bilaterally.  Mild facet arthrosis.    L4-L5:  Mild disc desiccation with shallow broad-based posterior  disc bulge.  No stenosis.  No definite facet arthrosis. Small  extraspinal synovial cyst arising from the inferior aspect of the  left L4-L5 facet.    L5-S1:  Mild facet hypertrophy.  No stenosis.    IMPRESSION:  1. No significant lumbar spine stenosis.  Mild  degenerative disc  disease.  2. Extraspinal synovial cysts are markers of facet degenerative  disease.  No neural compressive synovial cysts.   Provider: Ludie Lever, Ozell Dec

## 2024-06-02 NOTE — Progress Notes (Signed)
  Intake history:  Chief Complaint  Patient presents with   Knee Pain    No pain / states soreness      BP (!) 159/97   Pulse 80   Ht 5' 5 (1.651 m)   Wt 200 lb (90.7 kg)   BMI 33.28 kg/m  Body mass index is 33.28 kg/m.  Pharmacy? ________[facility]______________________________  WHAT ARE WE SEEING YOU FOR TODAY?   Both knees are sore, doesn't want surgery wants to discuss with you states they don't hurt but are aching and she has flare ups   How long has this bothered you? (DOI?DOS?WS?)  For years  Was there an injury? No  Anticoag.  No  Diabetes No  Heart disease No  Hypertension Yes  SMOKING HX No  Kidney disease No  Any ALLERGIES ________ Allergies  Allergen Reactions   Penicillins     Causes Yeast infection   Shellfish Allergy Swelling   ______________________________________   Treatment:  Have you taken:  Tylenol  No  Advil Yes  Had PT No  Had injection No  Other  _________________________

## 2024-06-02 NOTE — Patient Instructions (Addendum)
 1/4 inch heel pad for your left shoe get it at [facility] or you can order on [employer]  Leg length difference  Lower back disc disease

## 2024-06-06 ENCOUNTER — Other Ambulatory Visit: Payer: Self-pay | Admitting: Obstetrics & Gynecology

## 2024-06-06 DIAGNOSIS — R928 Other abnormal and inconclusive findings on diagnostic imaging of breast: Secondary | ICD-10-CM

## 2024-06-14 ENCOUNTER — Ambulatory Visit

## 2024-06-14 ENCOUNTER — Ambulatory Visit
Admission: RE | Admit: 2024-06-14 | Discharge: 2024-06-14 | Disposition: A | Source: Ambulatory Visit | Attending: Obstetrics & Gynecology | Admitting: Obstetrics & Gynecology

## 2024-06-14 DIAGNOSIS — R928 Other abnormal and inconclusive findings on diagnostic imaging of breast: Secondary | ICD-10-CM
